# Patient Record
Sex: Male | Born: 1965 | Race: Black or African American | Hispanic: No | Marital: Married | Smoking: Current every day smoker
Health system: Southern US, Community
[De-identification: ages and names within clinical notes are randomized; demographics above are authoritative.]

## PROBLEM LIST (undated history)

## (undated) DIAGNOSIS — F1010 Alcohol abuse, uncomplicated: Secondary | ICD-10-CM

## (undated) DIAGNOSIS — K219 Gastro-esophageal reflux disease without esophagitis: Secondary | ICD-10-CM

## (undated) HISTORY — PX: OTHER SURGICAL HISTORY: SHX169

## (undated) HISTORY — PX: HAND SURGERY: SHX662

## (undated) HISTORY — PX: ANKLE SURGERY: SHX546

## (undated) HISTORY — PX: APPENDECTOMY: SHX54

---

## 1998-02-24 ENCOUNTER — Emergency Department (HOSPITAL_COMMUNITY): Admission: EM | Admit: 1998-02-24 | Discharge: 1998-02-24 | Payer: Self-pay | Admitting: Emergency Medicine

## 1998-02-27 ENCOUNTER — Emergency Department (HOSPITAL_COMMUNITY): Admission: EM | Admit: 1998-02-27 | Discharge: 1998-02-27 | Payer: Self-pay | Admitting: Emergency Medicine

## 1998-07-15 ENCOUNTER — Encounter: Payer: Self-pay | Admitting: Emergency Medicine

## 1998-07-16 ENCOUNTER — Inpatient Hospital Stay (HOSPITAL_COMMUNITY): Admission: EM | Admit: 1998-07-16 | Discharge: 1998-07-18 | Payer: Self-pay | Admitting: Emergency Medicine

## 1998-07-24 ENCOUNTER — Encounter: Admission: RE | Admit: 1998-07-24 | Discharge: 1998-07-24 | Payer: Self-pay | Admitting: Family Medicine

## 1998-08-12 ENCOUNTER — Encounter: Admission: RE | Admit: 1998-08-12 | Discharge: 1998-08-12 | Payer: Self-pay | Admitting: Family Medicine

## 1998-08-31 ENCOUNTER — Emergency Department (HOSPITAL_COMMUNITY): Admission: EM | Admit: 1998-08-31 | Discharge: 1998-08-31 | Payer: Self-pay | Admitting: Emergency Medicine

## 1999-10-13 ENCOUNTER — Emergency Department (HOSPITAL_COMMUNITY): Admission: EM | Admit: 1999-10-13 | Discharge: 1999-10-13 | Payer: Self-pay | Admitting: Emergency Medicine

## 2002-03-20 ENCOUNTER — Emergency Department (HOSPITAL_COMMUNITY): Admission: EM | Admit: 2002-03-20 | Discharge: 2002-03-20 | Payer: Self-pay | Admitting: Emergency Medicine

## 2004-09-29 ENCOUNTER — Emergency Department (HOSPITAL_COMMUNITY): Admission: EM | Admit: 2004-09-29 | Discharge: 2004-09-29 | Payer: Self-pay | Admitting: Emergency Medicine

## 2004-10-03 ENCOUNTER — Ambulatory Visit (HOSPITAL_COMMUNITY): Admission: RE | Admit: 2004-10-03 | Discharge: 2004-10-03 | Payer: Self-pay | Admitting: Orthopedic Surgery

## 2004-12-06 ENCOUNTER — Emergency Department (HOSPITAL_COMMUNITY): Admission: EM | Admit: 2004-12-06 | Discharge: 2004-12-07 | Payer: Self-pay | Admitting: Emergency Medicine

## 2004-12-08 ENCOUNTER — Ambulatory Visit (HOSPITAL_COMMUNITY): Admission: RE | Admit: 2004-12-08 | Discharge: 2004-12-08 | Payer: Self-pay | Admitting: Orthopedic Surgery

## 2005-01-13 ENCOUNTER — Emergency Department (HOSPITAL_COMMUNITY): Admission: EM | Admit: 2005-01-13 | Discharge: 2005-01-13 | Payer: Self-pay | Admitting: Emergency Medicine

## 2006-04-05 ENCOUNTER — Emergency Department (HOSPITAL_COMMUNITY): Admission: EM | Admit: 2006-04-05 | Discharge: 2006-04-05 | Payer: Self-pay | Admitting: Emergency Medicine

## 2006-04-30 ENCOUNTER — Emergency Department (HOSPITAL_COMMUNITY): Admission: EM | Admit: 2006-04-30 | Discharge: 2006-04-30 | Payer: Self-pay | Admitting: Emergency Medicine

## 2006-09-27 ENCOUNTER — Emergency Department (HOSPITAL_COMMUNITY): Admission: EM | Admit: 2006-09-27 | Discharge: 2006-09-27 | Payer: Self-pay | Admitting: Emergency Medicine

## 2007-01-03 ENCOUNTER — Emergency Department (HOSPITAL_COMMUNITY): Admission: EM | Admit: 2007-01-03 | Discharge: 2007-01-03 | Payer: Self-pay | Admitting: Family Medicine

## 2007-10-25 ENCOUNTER — Emergency Department (HOSPITAL_COMMUNITY): Admission: EM | Admit: 2007-10-25 | Discharge: 2007-10-25 | Payer: Self-pay | Admitting: Emergency Medicine

## 2008-02-05 ENCOUNTER — Emergency Department (HOSPITAL_COMMUNITY): Admission: EM | Admit: 2008-02-05 | Discharge: 2008-02-05 | Payer: Self-pay | Admitting: Emergency Medicine

## 2008-02-08 ENCOUNTER — Inpatient Hospital Stay (HOSPITAL_COMMUNITY): Admission: EM | Admit: 2008-02-08 | Discharge: 2008-02-10 | Payer: Self-pay | Admitting: Emergency Medicine

## 2008-02-08 ENCOUNTER — Ambulatory Visit: Payer: Self-pay | Admitting: Internal Medicine

## 2008-02-10 ENCOUNTER — Emergency Department (HOSPITAL_COMMUNITY): Admission: EM | Admit: 2008-02-10 | Discharge: 2008-02-10 | Payer: Self-pay | Admitting: Emergency Medicine

## 2008-04-25 ENCOUNTER — Emergency Department (HOSPITAL_COMMUNITY): Admission: EM | Admit: 2008-04-25 | Discharge: 2008-04-25 | Payer: Self-pay | Admitting: Emergency Medicine

## 2008-05-23 ENCOUNTER — Emergency Department (HOSPITAL_COMMUNITY): Admission: EM | Admit: 2008-05-23 | Discharge: 2008-05-23 | Payer: Self-pay | Admitting: *Deleted

## 2008-05-24 ENCOUNTER — Emergency Department (HOSPITAL_COMMUNITY): Admission: EM | Admit: 2008-05-24 | Discharge: 2008-05-24 | Payer: Self-pay | Admitting: Emergency Medicine

## 2008-05-26 ENCOUNTER — Emergency Department (HOSPITAL_COMMUNITY): Admission: EM | Admit: 2008-05-26 | Discharge: 2008-05-26 | Payer: Self-pay | Admitting: Emergency Medicine

## 2008-09-07 ENCOUNTER — Emergency Department (HOSPITAL_COMMUNITY): Admission: EM | Admit: 2008-09-07 | Discharge: 2008-09-07 | Payer: Self-pay | Admitting: Emergency Medicine

## 2009-03-10 ENCOUNTER — Observation Stay (HOSPITAL_COMMUNITY): Admission: EM | Admit: 2009-03-10 | Discharge: 2009-03-10 | Payer: Self-pay | Admitting: Emergency Medicine

## 2009-03-12 ENCOUNTER — Emergency Department (HOSPITAL_COMMUNITY): Admission: EM | Admit: 2009-03-12 | Discharge: 2009-03-12 | Payer: Self-pay | Admitting: Emergency Medicine

## 2009-03-16 ENCOUNTER — Emergency Department (HOSPITAL_COMMUNITY): Admission: EM | Admit: 2009-03-16 | Discharge: 2009-03-16 | Payer: Self-pay | Admitting: Emergency Medicine

## 2009-03-18 ENCOUNTER — Emergency Department (HOSPITAL_COMMUNITY): Admission: EM | Admit: 2009-03-18 | Discharge: 2009-03-18 | Payer: Self-pay | Admitting: Emergency Medicine

## 2009-06-04 ENCOUNTER — Emergency Department (HOSPITAL_COMMUNITY): Admission: EM | Admit: 2009-06-04 | Discharge: 2009-06-04 | Payer: Self-pay | Admitting: Emergency Medicine

## 2009-12-07 ENCOUNTER — Emergency Department (HOSPITAL_COMMUNITY): Admission: EM | Admit: 2009-12-07 | Discharge: 2009-12-07 | Payer: Self-pay | Admitting: Emergency Medicine

## 2009-12-30 ENCOUNTER — Emergency Department (HOSPITAL_COMMUNITY): Admission: EM | Admit: 2009-12-30 | Discharge: 2009-12-30 | Payer: Self-pay | Admitting: Emergency Medicine

## 2010-01-08 ENCOUNTER — Emergency Department (HOSPITAL_COMMUNITY): Admission: EM | Admit: 2010-01-08 | Discharge: 2010-01-08 | Payer: Self-pay | Admitting: Emergency Medicine

## 2010-02-03 ENCOUNTER — Emergency Department (HOSPITAL_COMMUNITY): Admission: EM | Admit: 2010-02-03 | Discharge: 2010-02-03 | Payer: Self-pay | Admitting: Emergency Medicine

## 2010-06-25 ENCOUNTER — Emergency Department (HOSPITAL_COMMUNITY): Admission: EM | Admit: 2010-06-25 | Discharge: 2010-06-25 | Payer: Self-pay | Admitting: Emergency Medicine

## 2011-01-01 LAB — BASIC METABOLIC PANEL WITH GFR
BUN: 11 mg/dL (ref 6–23)
CO2: 26 meq/L (ref 19–32)
Calcium: 9.6 mg/dL (ref 8.4–10.5)
Chloride: 110 meq/L (ref 96–112)
Creatinine, Ser: 0.85 mg/dL (ref 0.4–1.5)
GFR calc non Af Amer: >60 mL/min
Glucose, Bld: 94 mg/dL (ref 70–99)
Potassium: 3.7 meq/L (ref 3.5–5.1)
Sodium: 141 meq/L (ref 135–145)

## 2011-01-01 LAB — DIFFERENTIAL
Basophils Absolute: 0.1 10*3/uL (ref 0.0–0.1)
Basophils Relative: 1 % (ref 0–1)
Eosinophils Absolute: 0.3 10*3/uL (ref 0.0–0.7)
Eosinophils Relative: 4 % (ref 0–5)
Lymphocytes Relative: 53 % — ABNORMAL HIGH (ref 12–46)
Lymphs Abs: 3.5 10*3/uL (ref 0.7–4.0)
Monocytes Absolute: 0.4 10*3/uL (ref 0.1–1.0)
Monocytes Relative: 6 % (ref 3–12)
Neutro Abs: 2.4 10*3/uL (ref 1.7–7.7)
Neutrophils Relative %: 36 % — ABNORMAL LOW (ref 43–77)

## 2011-01-01 LAB — SEDIMENTATION RATE: Sed Rate: 0 mm/h (ref 0–16)

## 2011-01-01 LAB — CBC
MCH: 24 pg — ABNORMAL LOW (ref 26.0–34.0)
MCV: 72.1 fL — ABNORMAL LOW (ref 78.0–100.0)
Platelets: 269 10*3/uL (ref 150–400)
RBC: 5.45 MIL/uL (ref 4.22–5.81)

## 2011-01-06 LAB — COMPREHENSIVE METABOLIC PANEL
Albumin: 4.6 g/dL (ref 3.5–5.2)
BUN: 10 mg/dL (ref 6–23)
CO2: 26 mEq/L (ref 19–32)
Calcium: 9.7 mg/dL (ref 8.4–10.5)
Chloride: 102 mEq/L (ref 96–112)
GFR calc Af Amer: >60 mL/min (ref >60)
Potassium: 3.5 mEq/L (ref 3.5–5.1)
Total Protein: 8.3 g/dL (ref 6.0–8.3)

## 2011-01-06 LAB — CBC
HCT: 44.2 % (ref 39.0–52.0)
Hemoglobin: 14.5 g/dL (ref 13.0–17.0)
MCHC: 32.8 g/dL (ref 30.0–36.0)
RDW: 15.4 % (ref 11.5–15.5)

## 2011-01-06 LAB — DIFFERENTIAL
Basophils Absolute: 0 10*3/uL (ref 0.0–0.1)
Basophils Relative: 0 % (ref 0–1)
Eosinophils Absolute: 0 10*3/uL (ref 0.0–0.7)
Lymphs Abs: 1.6 10*3/uL (ref 0.7–4.0)
Neutrophils Relative %: 82 % — ABNORMAL HIGH (ref 43–77)

## 2011-01-07 LAB — COMPREHENSIVE METABOLIC PANEL
BUN: 13 mg/dL (ref 6–23)
CO2: 26 mEq/L (ref 19–32)
Chloride: 108 mEq/L (ref 96–112)
Creatinine, Ser: 0.95 mg/dL (ref 0.4–1.5)
GFR calc non Af Amer: >60 mL/min (ref >60)
Glucose, Bld: 94 mg/dL (ref 70–99)
Total Bilirubin: 0.6 mg/dL (ref 0.3–1.2)

## 2011-01-07 LAB — DIFFERENTIAL
Basophils Absolute: 0.1 10*3/uL (ref 0.0–0.1)
Eosinophils Relative: 1 % (ref 0–5)
Lymphocytes Relative: 42 % (ref 12–46)
Neutro Abs: 4 10*3/uL (ref 1.7–7.7)

## 2011-01-07 LAB — CBC
HCT: 42.9 % (ref 39.0–52.0)
MCV: 77.1 fL — ABNORMAL LOW (ref 78.0–100.0)
RBC: 5.56 MIL/uL (ref 4.22–5.81)
WBC: 7.9 10*3/uL (ref 4.0–10.5)

## 2011-01-07 LAB — LIPASE, BLOOD: Lipase: 20 U/L (ref 11–59)

## 2011-01-12 LAB — DIFFERENTIAL
Basophils Absolute: 0.1 10*3/uL (ref 0.0–0.1)
Basophils Absolute: 0 10*3/uL (ref 0.0–0.1)
Basophils Relative: 1 % (ref 0–1)
Basophils Relative: 1 % (ref 0–1)
Eosinophils Absolute: 0.3 10*3/uL (ref 0.0–0.7)
Lymphocytes Relative: 35 % (ref 12–46)
Monocytes Absolute: 0.5 10*3/uL (ref 0.1–1.0)
Neutro Abs: 2.5 10*3/uL (ref 1.7–7.7)
Neutro Abs: 4 10*3/uL (ref 1.7–7.7)
Neutrophils Relative %: 38 % — ABNORMAL LOW (ref 43–77)
Neutrophils Relative %: 57 % (ref 43–77)

## 2011-01-12 LAB — COMPREHENSIVE METABOLIC PANEL
Albumin: 3.9 g/dL (ref 3.5–5.2)
Alkaline Phosphatase: 64 U/L (ref 39–117)
Alkaline Phosphatase: 58 U/L (ref 39–117)
BUN: 12 mg/dL (ref 6–23)
BUN: 10 mg/dL (ref 6–23)
CO2: 29 mEq/L (ref 19–32)
Chloride: 105 mEq/L (ref 96–112)
Chloride: 109 mEq/L (ref 96–112)
Creatinine, Ser: 0.91 mg/dL (ref 0.4–1.5)
GFR calc non Af Amer: >60 mL/min (ref >60)
Glucose, Bld: 112 mg/dL — ABNORMAL HIGH (ref 70–99)
Glucose, Bld: 97 mg/dL (ref 70–99)
Potassium: 5.2 mEq/L — ABNORMAL HIGH (ref 3.5–5.1)
Potassium: 3.5 mEq/L (ref 3.5–5.1)
Total Bilirubin: <0.1 mg/dL — ABNORMAL LOW (ref 0.3–1.2)
Total Bilirubin: 0.6 mg/dL (ref 0.3–1.2)
Total Protein: 6.9 g/dL (ref 6.0–8.3)

## 2011-01-12 LAB — PROTIME-INR
INR: 0.89 (ref 0.00–1.49)
Prothrombin Time: 12 seconds (ref 11.6–15.2)

## 2011-01-12 LAB — CBC
HCT: 41 % (ref 39.0–52.0)
HCT: 41.6 % (ref 39.0–52.0)
Hemoglobin: 12.8 g/dL — ABNORMAL LOW (ref 13.0–17.0)
Hemoglobin: 13.6 g/dL (ref 13.0–17.0)
MCV: 77.2 fL — ABNORMAL LOW (ref 78.0–100.0)
Platelets: 240 10*3/uL (ref 150–400)
RBC: 5.29 MIL/uL (ref 4.22–5.81)
RDW: 16.4 % — ABNORMAL HIGH (ref 11.5–15.5)
RDW: 15.6 % — ABNORMAL HIGH (ref 11.5–15.5)
WBC: 7.2 10*3/uL (ref 4.0–10.5)

## 2011-01-12 LAB — LIPASE, BLOOD: Lipase: 15 U/L (ref 11–59)

## 2011-01-12 LAB — URINALYSIS, ROUTINE W REFLEX MICROSCOPIC
Bilirubin Urine: NEGATIVE
Bilirubin Urine: NEGATIVE
Glucose, UA: NEGATIVE mg/dL
Hgb urine dipstick: NEGATIVE
Nitrite: NEGATIVE
Protein, ur: NEGATIVE mg/dL
Specific Gravity, Urine: 1.015 (ref 1.005–1.030)
Urobilinogen, UA: 0.2 mg/dL (ref 0.0–1.0)
Urobilinogen, UA: 1 mg/dL (ref 0.0–1.0)

## 2011-01-12 LAB — ETHANOL: Alcohol, Ethyl (B): <5 mg/dL (ref 0–10)

## 2011-01-12 LAB — URINE CULTURE

## 2011-01-27 LAB — COMPREHENSIVE METABOLIC PANEL
ALT: 36 U/L (ref 0–53)
ALT: 21 U/L (ref 0–53)
AST: 53 U/L — ABNORMAL HIGH (ref 0–37)
AST: 29 U/L (ref 0–37)
Albumin: 4 g/dL (ref 3.5–5.2)
CO2: 29 mEq/L (ref 19–32)
CO2: 28 mEq/L (ref 19–32)
Calcium: 10.1 mg/dL (ref 8.4–10.5)
Chloride: 104 mEq/L (ref 96–112)
Creatinine, Ser: 0.81 mg/dL (ref 0.4–1.5)
GFR calc Af Amer: >60 mL/min (ref >60)
GFR calc Af Amer: >60 mL/min (ref >60)
GFR calc non Af Amer: >60 mL/min (ref >60)
Potassium: 3.2 mEq/L — ABNORMAL LOW (ref 3.5–5.1)
Potassium: 3.8 mEq/L (ref 3.5–5.1)
Sodium: 140 mEq/L (ref 135–145)
Sodium: 140 mEq/L (ref 135–145)
Total Bilirubin: 0.5 mg/dL (ref 0.3–1.2)

## 2011-01-27 LAB — URINALYSIS, ROUTINE W REFLEX MICROSCOPIC
Bilirubin Urine: NEGATIVE
Glucose, UA: NEGATIVE mg/dL
Hgb urine dipstick: NEGATIVE
Ketones, ur: 15 mg/dL — AB
Nitrite: NEGATIVE
Protein, ur: NEGATIVE mg/dL
Protein, ur: NEGATIVE mg/dL
Specific Gravity, Urine: 1.011 (ref 1.005–1.030)
Urobilinogen, UA: 0.2 mg/dL (ref 0.0–1.0)

## 2011-01-27 LAB — DIFFERENTIAL
Basophils Absolute: 0.1 10*3/uL (ref 0.0–0.1)
Eosinophils Absolute: 0.1 10*3/uL (ref 0.0–0.7)
Eosinophils Absolute: 0 10*3/uL (ref 0.0–0.7)
Eosinophils Relative: 1 % (ref 0–5)
Eosinophils Relative: 0 % (ref 0–5)
Lymphocytes Relative: 18 % (ref 12–46)
Lymphs Abs: 1.8 10*3/uL (ref 0.7–4.0)
Lymphs Abs: 1.6 10*3/uL (ref 0.7–4.0)
Monocytes Absolute: 0.3 10*3/uL (ref 0.1–1.0)
Monocytes Relative: 2 % — ABNORMAL LOW (ref 3–12)

## 2011-01-27 LAB — POCT I-STAT, CHEM 8
BUN: 8 mg/dL (ref 6–23)
Calcium, Ion: 1.06 mmol/L — ABNORMAL LOW (ref 1.12–1.32)
Calcium, Ion: 1.12 mmol/L (ref 1.12–1.32)
Chloride: 108 mEq/L (ref 96–112)
Creatinine, Ser: 1 mg/dL (ref 0.4–1.5)
Creatinine, Ser: 1.1 mg/dL (ref 0.4–1.5)
Glucose, Bld: 117 mg/dL — ABNORMAL HIGH (ref 70–99)
Potassium: 3.6 mEq/L (ref 3.5–5.1)
TCO2: 28 mmol/L (ref 0–100)

## 2011-01-27 LAB — CBC
MCHC: 32.7 g/dL (ref 30.0–36.0)
MCV: 75.7 fL — ABNORMAL LOW (ref 78.0–100.0)
Platelets: 308 10*3/uL (ref 150–400)
RBC: 5.47 MIL/uL (ref 4.22–5.81)
RBC: 6.25 MIL/uL — ABNORMAL HIGH (ref 4.22–5.81)
WBC: 9.8 10*3/uL (ref 4.0–10.5)
WBC: 9.3 10*3/uL (ref 4.0–10.5)

## 2011-01-27 LAB — LIPASE, BLOOD: Lipase: 17 U/L (ref 11–59)

## 2011-03-03 NOTE — Consult Note (Signed)
[name], [name] NO.:  [id number]   MEDICAL RECORD NO.:  [id number]           PATIENT TYPE:   LOCATION:                                 FACILITY:   PHYSICIAN:  Stephani Police, PA    DATE OF BIRTH:  01-Jul-1966   DATE OF CONSULTATION:  02/09/2008  DATE OF DISCHARGE:                                 CONSULTATION   REFERRING PHYSICIAN:  1. Lowella Bandy of [facility].  2. Dr.  Fayrene Fearing L. Edwards of  [facility].   We were asked to see by Dr. Cherly Hensen of [facility] for a  guaiac positive stool and abdominal pain and vomiting.   HISTORY OF PRESENT ILLNESS:  This is a 44 year old male, who is admitted  with 5 days of vomiting.  He tells me that he has epigastric and right  upper quadrant pain.  He reports vomiting started after eating chicken  last Saturday.  He vomited so much that he went to the ER on Sunday.  He  was released and he had to return on Wednesday with continued vomiting  and severe epigastric pain.  He reports 20 years of intermittent  abdominal pain and vomiting.  He says, he is usually able to work  through these episodes on his own.  He denies any hematemesis this past  week and he denies any changes in bowel habit.  He does tell me that he  has had tooth pain that necessitated him taking 4-6 ibuprofen 800 mg  tablets as well as 8 Tylenol 3 use over the last week.  He does also  report a history of heavy alcohol use, but now drinks much less for the  past month.   PAST MEDICAL HISTORY:  Significant for:  1. Pancreatitis.  2. Alcoholic gastritis.  3. GERD.  4. Wrist surgery.  5. History of tuberculosis.   He has never had an endoscopy or a colonoscopy.   CURRENT MEDICATIONS:  1. Zantac.  2. Percocet.  3. Phenergan.  4. 800 mg ibuprofen as well as Tylenol 3.   ALLERGIES:  He has allergies to TYLENOL and IBUPROFEN that cause GI  upset.   FAMILY HISTORY:  Positive for lung cancer, negative for colon cancer and  peptic ulcer disease.   SOCIAL HISTORY:  Positive for tobacco and positive for occasional  alcohol use.  He denies drug use, but his urine drug screen is positive  for THC and opiates.   REVIEW OF SYSTEMS:  Positive for tooth pain.  Negative for fever,  palpitations or shortness of breath.   PHYSICAL EXAMINATION:  GENERAL:  He is alert and oriented in no apparent  distress.  He appears healthy.  VITAL SIGNS:  Temperature is 97.7, pulse is 74, respirations are 19 and  blood pressure is 118/65.  CARDIAC:  His heart has a regular rate and rhythm.  LUNGS:  Clear to auscultation without wheezes or crackles.  ABDOMEN:  Soft, nontender and nondistended with positive bowel sounds.  He is guaiac positive.   LABORATORY DATA:  Again, urine drug screen is positive for  THC and  opiates.  His hemoglobin on admission, yesterday, was 16.4 and with the  hydration it has dropped to 12.8 today.  He has a hematocrit of 39.6  now, white count 9.1 and platelets 232,000.  His lipase was 63 on  admission and is 18 now.  He is guaiac positive.  He has a PT of 13.3,  INR of 1, creatinine kinase is 6867, AST is 156, ALT is 49, alk phos 62  and total bilirubin 0.5.  Abdominal x-ray shows no acute findings.  Abdominal ultrasound shows a normal gallbladder.  No ascites and normal  common bile duct.   ASSESSMENT:  Dr.  Carman Ching has seen and examined the patient,  collected a history and his impression is that this patient has had  longterm abdominal pain with nausea and vomiting.  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  We will evaluate for  a possible ulcer disease or gastroenteritis with an upper endoscopy  first thing tomorrow morning at 08:30 a.m.      Stephani Police, [place]     MLY/MEDQ  D:  02/09/2008  T:  02/10/2008  Job:  [id number]   cc:   Fayrene Fearing L. Malon Kindle., M.D.

## 2011-03-03 NOTE — Op Note (Signed)
[name], [name] NO.:  [id number]   MEDICAL RECORD NO.:  [id number]          PATIENT TYPE:  EMS   LOCATION:  MAJO                         FACILITY:  [facility]   PHYSICIAN:  James L. Malon Kindle., M.D.DATE OF BIRTH:  [birth year]   DATE OF PROCEDURE:  02/10/2008  DATE OF DISCHARGE:                               OPERATIVE REPORT   PROCEDURE:  Esophagogastroduodenoscopy with biopsy.   MEDICATIONS:  1. Phenergan 25 mg.  2. Cetacaine spray.  3. Fentanyl 100 mcg.  4. Versed 10 mg IV.   INDICATIONS:  Hematemesis and abdominal pain.   DESCRIPTION OF PROCEDURE:  The procedure explained to the patient and  consent obtained.  In the left lateral decubitus position, Pentax  operative scope inserted blindly in the esophagus and advanced under  direct visualization.  The patient had a widely patent GE junction with  a hiatal hernia, with ulcerative esophagitis in the hernia sac and in  the distal esophagus.  The proximal stomach was normal.  There were  several gastric erosions and a small gastric ulcer in immediate  prepyloric area.  In the duodenum was duodenitis and small duodenal  ulcers, not bleeding.  There was no active bleeding.  The scope was  drawn back into the stomach and biopsy was taken for rapid urease test  for Helicobacter.  The scope was withdrawn and initial findings  confirmed.  The patient tolerated the procedure well.  There were no  immediate complications.   ASSESSMENT:  1. Gastric ulcer, duodenal ulcer, duodenitis, and gastritis.  2. Ulcerative esophagitis.   PLAN:  We will discharge on PPI therapy.  Check CLO test.  Have the  patient see me in the office in 2 months, and we may consider another  study to document healing of his ulcer.           ______________________________  Llana Aliment. Malon Kindle., M.D.     Waldron Session  D:  02/10/2008  T:  02/11/2008  Job:  [id number]   cc:   C. Ulyess Mort, M.D.
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
## 2011-03-06 NOTE — Op Note (Signed)
NAME:  [name], [name] NO.:  [id number]   MEDICAL RECORD NO.:  [id number]          PATIENT TYPE:  OIB   LOCATION:                               FACILITY:  [facility]   PHYSICIAN:  Artist Pais. 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   PROCEDURE:  Open reduction and internal fixation above using dorsal and  volar approaches. .   SURGEON:  Artist Pais. Mina Marble, M.D.   ASSISTANT:  Aura Fey. Bobbe Medico.   ANESTHESIA:  General.   TOURNIQUET TIME:  1 hour and 10 minutes.   COMPLICATIONS:  None.   DRAINS:  None.   DESCRIPTION OF PROCEDURE:  The patient was taken to the operating room.  After the induction of adequate general anesthesia, the right upper  extremity was prepped and draped in the usual sterile fashion.  An Esmarch  was used to exsanguinate the limb.  Tourniquet was inflated to 250 mmHg.  At  this point in time, an extended carpal tunnel incision was made on the volar  aspect of the right wrist.  Incision was taken down through the skin and  subcutaneous tissue.  The median nerve was identified proximally in the  carpal canal, and the carpal was loosed.  The flexor tendons were retracted  to the side.  There was a large amount of bleeding and hemorrhage in the  volar wrist capsule, and the lunate was seen outside the carpal tunnel.  The  lunate had a small amount of periosteum still attached to it in a remnant of  the capsule.  This was reduced into the radial carpal joint.  The disrupted  capsule was repaired with FiberWire #2 with vertical mattress sutures.  The  hand was then irrigated, fully pronated.  A dorsal approach was made.  The  extensor ends were retracted.   The capsule was incised over the SL ligament  area.  There was complete disruption of the SL ligament.  The scapholunate  interval was reduced and pinned using 0.62 K wires x 2 from the scaphoid  into the lunate followed by a distal scaphoid pin into the capitate to keep  the scaphoid stable.  These wires were all bent below the skin after being  cut through a separate incision.  The dorsal capsule and SL  ligament area was repaired using 2-0 FiberWire followed by capsule closure  again with 2-0 FiberWire, and the skin on both sides with nylon sutures. The  patient was placed in a sterile dressing with Xeroform, 4 x 4 fluffs, and a  volar splint.  The patient tolerated the procedure well and went to recovery  in stable fashion.      Matt   MAW/MEDQ  D:  10/08/2004  T:  10/08/2004  Job:  [id number]

## 2011-04-19 IMAGING — CR DG ABDOMEN ACUTE W/ 1V CHEST
3 series · 3 of 3 positions shown · non-contrast
Comparison: 03/10/2009

CLINICAL DATA: Questioned ulcer.  Abdominal pain.  Smoker.

ACUTE ABDOMEN SERIES (ABDOMEN 2 VIEW & CHEST 1 VIEW)

[w chest pa]
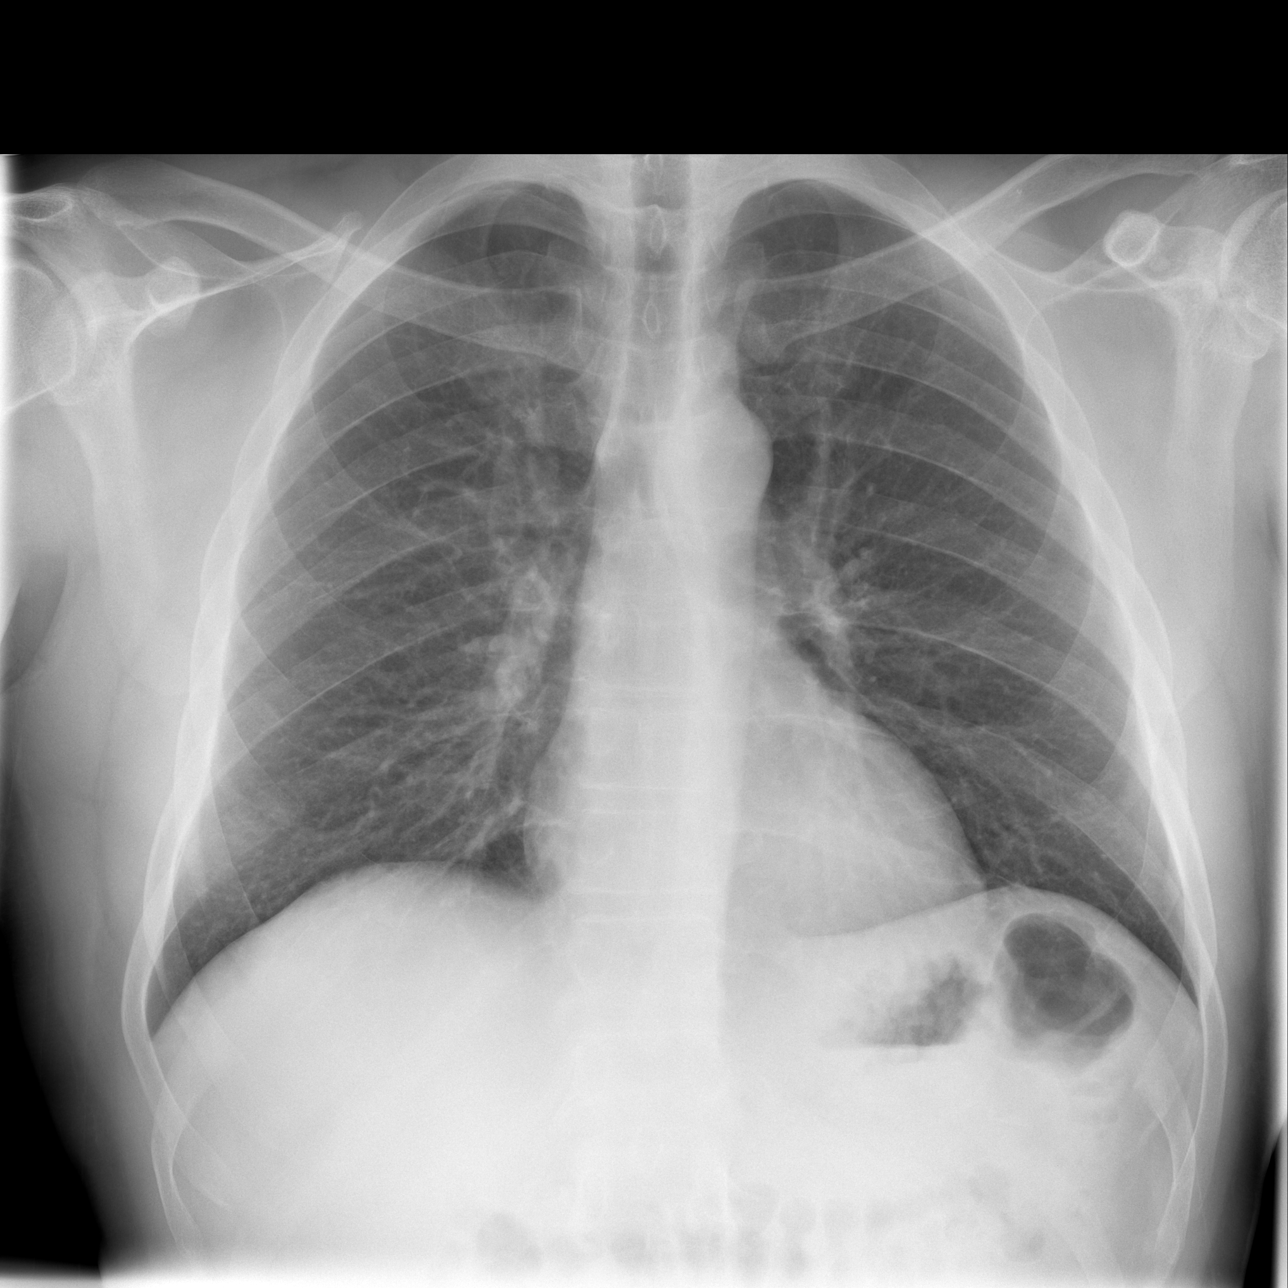

[w abdomen upright]
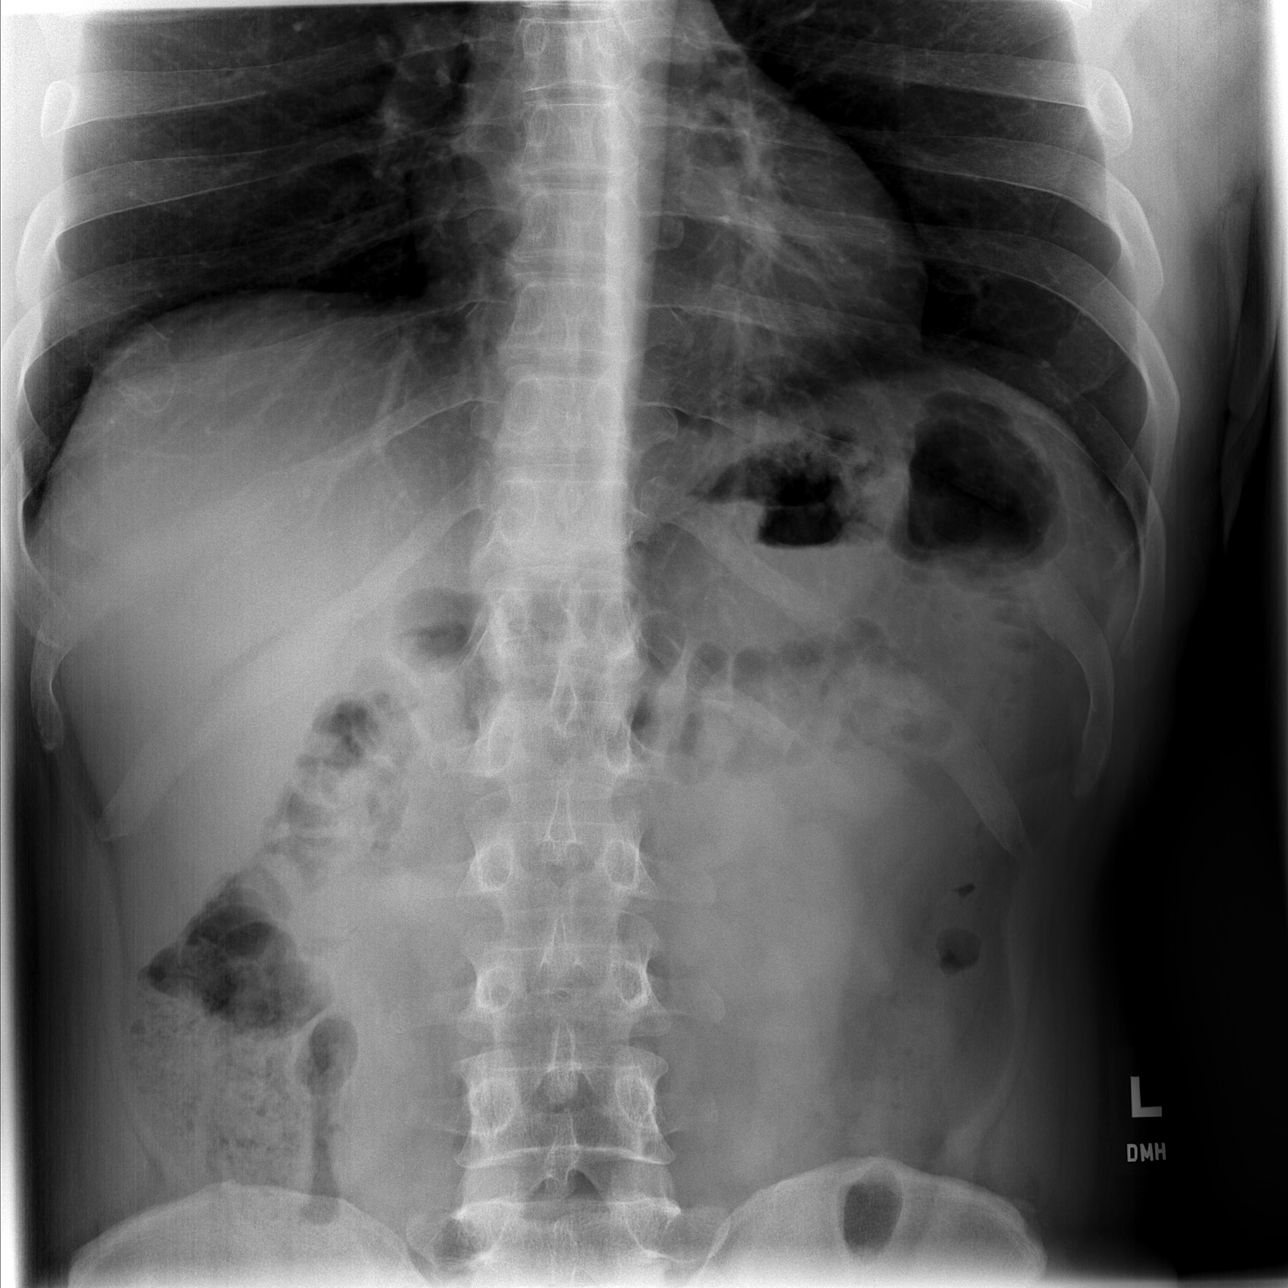

[t abdomen supine]
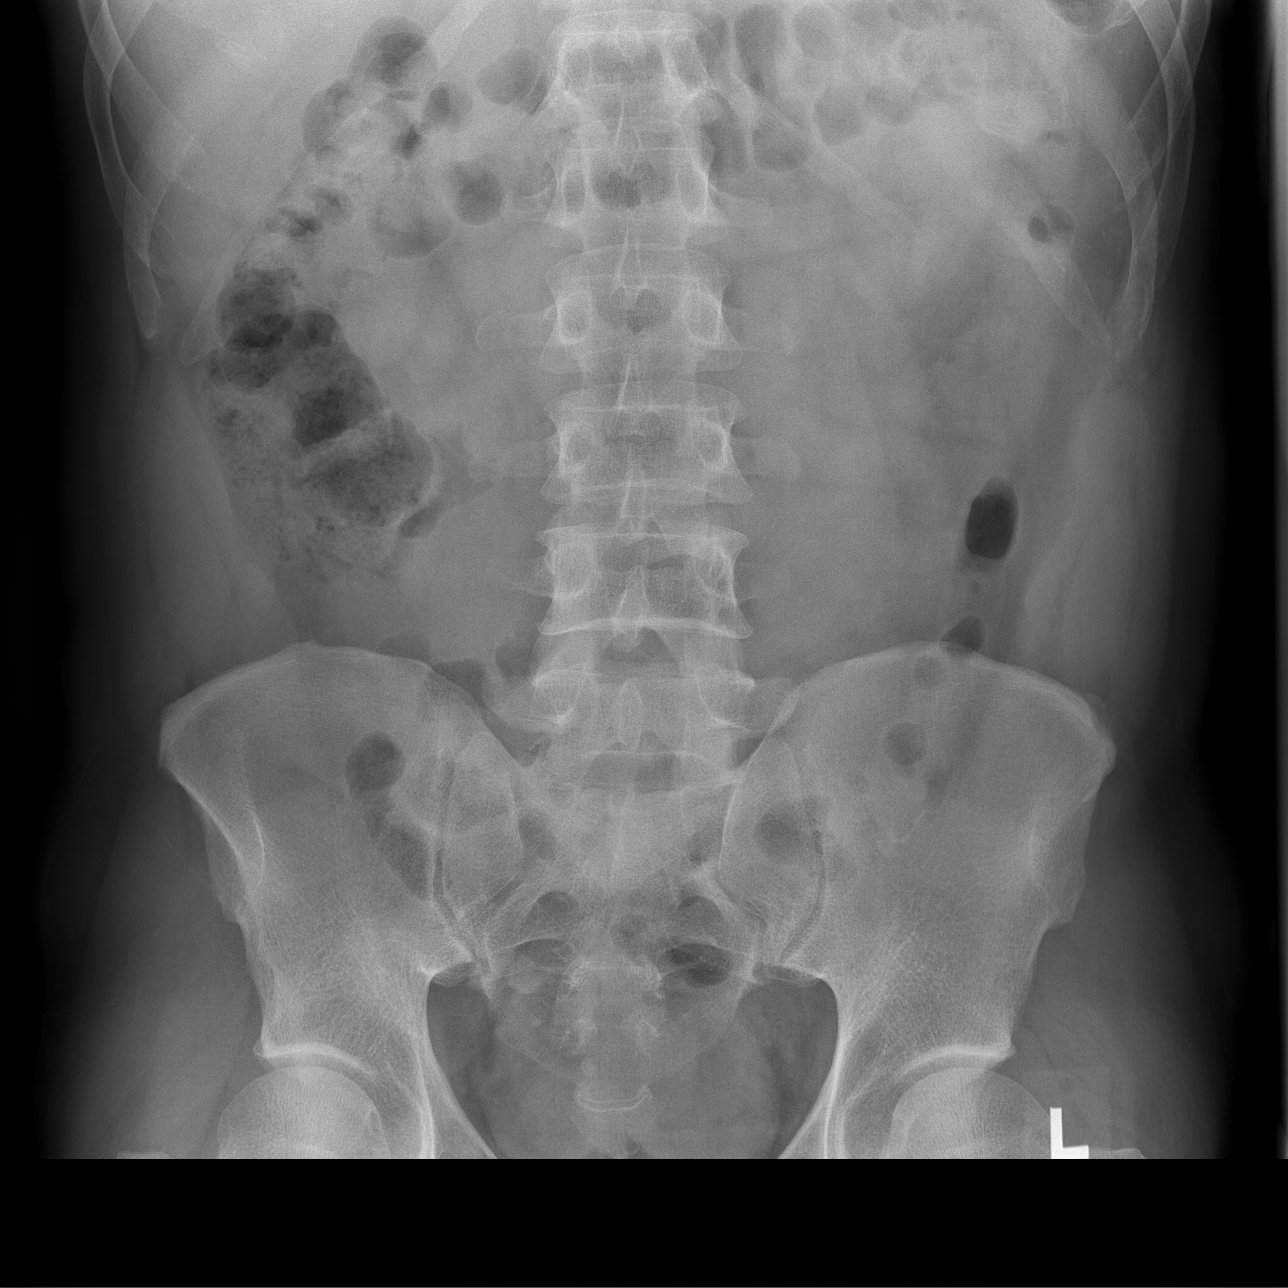

[3 of 3 positions shown; findings below may reference images not displayed]

FINDINGS: No active cardiopulmonary disease.  Bowel gas pattern
unremarkable.  No extraluminal gas.  No unusual calcifications.
IMPRESSION: Negative chest and abdomen.

## 2011-05-08 ENCOUNTER — Emergency Department (HOSPITAL_COMMUNITY)
Admission: EM | Admit: 2011-05-08 | Discharge: 2011-05-08 | Disposition: A | Discharge status: Home / Self Care | Payer: Self-pay | Attending: Emergency Medicine | Admitting: Emergency Medicine

## 2011-05-08 ENCOUNTER — Emergency Department (HOSPITAL_COMMUNITY): Payer: Self-pay

## 2011-05-08 DIAGNOSIS — M25519 Pain in unspecified shoulder: Secondary | ICD-10-CM | POA: Insufficient documentation

## 2011-05-08 DIAGNOSIS — Z8711 Personal history of peptic ulcer disease: Secondary | ICD-10-CM | POA: Insufficient documentation

## 2011-05-08 DIAGNOSIS — F17200 Nicotine dependence, unspecified, uncomplicated: Secondary | ICD-10-CM | POA: Insufficient documentation

## 2011-05-08 DIAGNOSIS — K219 Gastro-esophageal reflux disease without esophagitis: Secondary | ICD-10-CM | POA: Insufficient documentation

## 2011-07-09 LAB — COMPREHENSIVE METABOLIC PANEL
Albumin: 5.2
BUN: 20
Chloride: 95 — ABNORMAL LOW
Creatinine, Ser: 1.22
Total Bilirubin: 1.6 — ABNORMAL HIGH
Total Protein: 8.6 — ABNORMAL HIGH

## 2011-07-09 LAB — DIFFERENTIAL
Basophils Absolute: 0
Lymphocytes Relative: 18
Monocytes Absolute: 0.9
Neutro Abs: 8.8 — ABNORMAL HIGH

## 2011-07-09 LAB — CBC
HCT: 50
MCV: 74.1 — ABNORMAL LOW
Platelets: 342
RDW: 15.6 — ABNORMAL HIGH

## 2011-07-14 LAB — POCT I-STAT, CHEM 8
BUN: 23
Calcium, Ion: 1.1 — ABNORMAL LOW
Chloride: 96
Creatinine, Ser: 1.3
TCO2: 36

## 2011-07-14 LAB — DIFFERENTIAL
Basophils Absolute: 0
Basophils Absolute: 0
Basophils Relative: 0
Basophils Relative: 1
Eosinophils Absolute: 0
Eosinophils Absolute: 0.1
Eosinophils Relative: 1
Lymphocytes Relative: 21
Lymphs Abs: 2.5
Monocytes Absolute: 0.6
Monocytes Relative: 9
Neutro Abs: 6.7
Neutro Abs: 8.2 — ABNORMAL HIGH
Neutrophils Relative %: 69
Neutrophils Relative %: 84 — ABNORMAL HIGH

## 2011-07-14 LAB — URINALYSIS, ROUTINE W REFLEX MICROSCOPIC
Bilirubin Urine: NEGATIVE
Bilirubin Urine: NEGATIVE
Glucose, UA: NEGATIVE
Hgb urine dipstick: NEGATIVE
Hgb urine dipstick: NEGATIVE
Ketones, ur: 15 — AB
Leukocytes, UA: NEGATIVE
Nitrite: NEGATIVE
Protein, ur: NEGATIVE
Protein, ur: NEGATIVE
Specific Gravity, Urine: 1.022
Specific Gravity, Urine: 1.024
Urobilinogen, UA: 0.2
Urobilinogen, UA: 0.2
pH: 7.5

## 2011-07-14 LAB — CBC
HCT: 39.6
HCT: 46.3
HCT: 46.3
Hemoglobin: 13
Hemoglobin: 14.8
Hemoglobin: 15.4
MCHC: 33.3
MCV: 74.2 — ABNORMAL LOW
MCV: 74.2 — ABNORMAL LOW
Platelets: 272
Platelets: 306
RBC: 6.84 — ABNORMAL HIGH
RDW: 14.4
RDW: 14.5
RDW: 14.6
WBC: 11.9 — ABNORMAL HIGH
WBC: 5.7
WBC: 8
WBC: 9.1

## 2011-07-14 LAB — COMPREHENSIVE METABOLIC PANEL
Albumin: 3.3 — ABNORMAL LOW
Alkaline Phosphatase: 63
BUN: 7
BUN: 7
Calcium: 8.8
Chloride: 99
Creatinine, Ser: 0.88
GFR calc non Af Amer: >60
Glucose, Bld: 107 — ABNORMAL HIGH
Glucose, Bld: 94
Potassium: 3.3 — ABNORMAL LOW
Total Bilirubin: 0.7
Total Protein: 6

## 2011-07-14 LAB — BASIC METABOLIC PANEL
BUN: 14
CO2: 28
Chloride: 109
GFR calc non Af Amer: >60
GFR calc non Af Amer: >60
Glucose, Bld: 126 — ABNORMAL HIGH
Glucose, Bld: 98
Potassium: 3.7
Potassium: 4
Sodium: 140
Sodium: 142

## 2011-07-14 LAB — URINE MICROSCOPIC-ADD ON

## 2011-07-14 LAB — RETICULOCYTES: RBC.: 5.54

## 2011-07-14 LAB — HEPATITIS PANEL, ACUTE
HCV Ab: POSITIVE — AB
Hep A IgM: NEGATIVE
Hep B C IgM: NEGATIVE
Hepatitis B Surface Ag: NEGATIVE

## 2011-07-14 LAB — OCCULT BLOOD X 1 CARD TO LAB, STOOL: Fecal Occult Bld: POSITIVE

## 2011-07-14 LAB — HEPATIC FUNCTION PANEL
ALT: 21
ALT: 49
AST: 35
Albumin: 4.8
Alkaline Phosphatase: 65
Bilirubin, Direct: <0.1
Total Bilirubin: 0.9
Total Protein: 7.7

## 2011-07-14 LAB — CARDIAC PANEL(CRET KIN+CKTOT+MB+TROPI)
CK, MB: 3.1
CK, MB: 3.9
CK, MB: 4.8 — ABNORMAL HIGH
Relative Index: 0.1
Relative Index: 0.1
Relative Index: 0.1
Total CK: 5215 — ABNORMAL HIGH
Total CK: 6867 — ABNORMAL HIGH
Total CK: 7729 — ABNORMAL HIGH
Troponin I: 0.01
Troponin I: 0.02
Troponin I: 0.02

## 2011-07-14 LAB — APTT: aPTT: 31

## 2011-07-14 LAB — ETHANOL
Alcohol, Ethyl (B): <5
Alcohol, Ethyl (B): <5

## 2011-07-14 LAB — CLOTEST (H. PYLORI), BIOPSY: Helicobacter screen: POSITIVE — AB

## 2011-07-14 LAB — IRON AND TIBC
Iron: 225 — ABNORMAL HIGH
UIBC: <55

## 2011-07-14 LAB — RAPID URINE DRUG SCREEN, HOSP PERFORMED
Amphetamines: NOT DETECTED
Barbiturates: NOT DETECTED

## 2011-07-14 LAB — HELICOBACTER PYLORI ANTIGEN DET, STOOL: H pylori Ag, Stl: NEGATIVE

## 2011-07-14 LAB — FERRITIN: Ferritin: 237 (ref 22–322)

## 2011-07-17 LAB — COMPREHENSIVE METABOLIC PANEL
ALT: 32
Albumin: 4.3
Alkaline Phosphatase: 61
BUN: 16
Chloride: 103
Glucose, Bld: 121 — ABNORMAL HIGH
Potassium: 3.4 — ABNORMAL LOW
Sodium: 136
Total Bilirubin: 1
Total Protein: 7.6

## 2011-07-17 LAB — POCT I-STAT, CHEM 8
BUN: 20
Calcium, Ion: 1.21
Chloride: 104
Creatinine, Ser: 1.1
TCO2: 27

## 2011-07-17 LAB — URINALYSIS, ROUTINE W REFLEX MICROSCOPIC
Leukocytes, UA: NEGATIVE
Nitrite: NEGATIVE
Specific Gravity, Urine: 1.036 — ABNORMAL HIGH
pH: 6

## 2011-07-17 LAB — HEPATIC FUNCTION PANEL
Albumin: 4.4
Alkaline Phosphatase: 64
Total Protein: 8.6 — ABNORMAL HIGH

## 2011-07-17 LAB — LIPASE, BLOOD: Lipase: 18

## 2011-07-17 LAB — DIFFERENTIAL
Basophils Absolute: 0
Basophils Absolute: 0
Basophils Relative: 0
Basophils Relative: 0
Eosinophils Absolute: 0
Eosinophils Absolute: 0
Lymphs Abs: 2.1
Monocytes Absolute: 0.3
Monocytes Relative: 3
Neutro Abs: 8.3 — ABNORMAL HIGH
Neutrophils Relative %: 65

## 2011-07-17 LAB — CBC
HCT: 45.9
Hemoglobin: 14.6
MCV: 75.5 — ABNORMAL LOW
Platelets: 291
Platelets: 308
WBC: 10.1
WBC: 9.1

## 2011-07-17 LAB — URINE MICROSCOPIC-ADD ON

## 2011-07-17 LAB — B-NATRIURETIC PEPTIDE (CONVERTED LAB): Pro B Natriuretic peptide (BNP): 43

## 2011-07-17 LAB — TROPONIN I: Troponin I: 0.01

## 2011-10-09 ENCOUNTER — Emergency Department (HOSPITAL_COMMUNITY)
Admission: EM | Admit: 2011-10-09 | Discharge: 2011-10-09 | Disposition: A | Discharge status: Home / Self Care | Payer: Self-pay | Attending: Emergency Medicine | Admitting: Emergency Medicine

## 2011-10-09 ENCOUNTER — Emergency Department (HOSPITAL_COMMUNITY): Payer: Self-pay

## 2011-10-09 ENCOUNTER — Encounter: Payer: Self-pay | Admitting: *Deleted

## 2011-10-09 DIAGNOSIS — M25562 Pain in left knee: Secondary | ICD-10-CM

## 2011-10-09 DIAGNOSIS — M25569 Pain in unspecified knee: Secondary | ICD-10-CM | POA: Insufficient documentation

## 2011-10-09 DIAGNOSIS — M7120 Synovial cyst of popliteal space [Baker], unspecified knee: Secondary | ICD-10-CM

## 2011-10-09 HISTORY — DX: Gastro-esophageal reflux disease without esophagitis: K21.9

## 2011-10-09 MED ORDER — OXYCODONE-ACETAMINOPHEN 5-325 MG PO TABS: 1.0000 | ORAL_TABLET | ORAL | Status: AC | PRN

## 2011-10-09 MED ORDER — IBUPROFEN 200 MG PO TABS
400.0000 mg | ORAL_TABLET | Freq: Once | ORAL | Status: AC
  Administered 2011-10-09: 400 mg via ORAL
  Filled 2011-10-09: qty 1
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
## 2011-10-09 NOTE — ED Notes (Signed)
Pt in c/o left knee swelling since Tuesday, denies injury, swelling noted, states it feels like there is fluid on his knee

## 2011-12-15 ENCOUNTER — Emergency Department (HOSPITAL_COMMUNITY)
Admission: EM | Admit: 2011-12-15 | Discharge: 2011-12-15 | Disposition: A | Discharge status: Home / Self Care | Payer: Self-pay | Attending: Emergency Medicine | Admitting: Emergency Medicine

## 2011-12-15 ENCOUNTER — Encounter (HOSPITAL_COMMUNITY): Payer: Self-pay | Admitting: *Deleted

## 2011-12-15 DIAGNOSIS — R10819 Abdominal tenderness, unspecified site: Secondary | ICD-10-CM | POA: Insufficient documentation

## 2011-12-15 DIAGNOSIS — F17200 Nicotine dependence, unspecified, uncomplicated: Secondary | ICD-10-CM | POA: Insufficient documentation

## 2011-12-15 DIAGNOSIS — N390 Urinary tract infection, site not specified: Secondary | ICD-10-CM | POA: Insufficient documentation

## 2011-12-15 DIAGNOSIS — R109 Unspecified abdominal pain: Secondary | ICD-10-CM | POA: Insufficient documentation

## 2011-12-15 DIAGNOSIS — R112 Nausea with vomiting, unspecified: Secondary | ICD-10-CM | POA: Insufficient documentation

## 2011-12-15 HISTORY — DX: Alcohol abuse, uncomplicated: F10.10

## 2011-12-15 LAB — DIFFERENTIAL
Eosinophils Absolute: 0 10*3/uL (ref 0.0–0.7)
Eosinophils Relative: 0 % (ref 0–5)
Lymphocytes Relative: 16 % (ref 12–46)
Lymphs Abs: 2.1 10*3/uL (ref 0.7–4.0)
Neutro Abs: 10 10*3/uL — ABNORMAL HIGH (ref 1.7–7.7)
Neutrophils Relative %: 75 % (ref 43–77)

## 2011-12-15 LAB — COMPREHENSIVE METABOLIC PANEL
CO2: 28 mEq/L (ref 19–32)
Calcium: 10.8 mg/dL — ABNORMAL HIGH (ref 8.4–10.5)
Creatinine, Ser: 1.35 mg/dL (ref 0.50–1.35)
GFR calc Af Amer: 72 mL/min — ABNORMAL LOW (ref >90)
GFR calc non Af Amer: 62 mL/min — ABNORMAL LOW (ref >90)
Glucose, Bld: 130 mg/dL — ABNORMAL HIGH (ref 70–99)

## 2011-12-15 LAB — URINALYSIS, ROUTINE W REFLEX MICROSCOPIC
Glucose, UA: NEGATIVE mg/dL
Protein, ur: 100 mg/dL — AB

## 2011-12-15 LAB — CBC
HCT: 46.7 % (ref 39.0–52.0)
Hemoglobin: 15.9 g/dL (ref 13.0–17.0)
MCHC: 34 g/dL (ref 30.0–36.0)
RDW: 15.1 % (ref 11.5–15.5)
WBC: 13.3 10*3/uL — ABNORMAL HIGH (ref 4.0–10.5)

## 2011-12-15 LAB — URINE MICROSCOPIC-ADD ON

## 2011-12-15 LAB — LIPASE, BLOOD: Lipase: 15 U/L (ref 11–59)

## 2011-12-15 MED ORDER — HYDROMORPHONE HCL PF 1 MG/ML IJ SOLN
0.5000 mg | Freq: Once | INTRAMUSCULAR | Status: AC
  Administered 2011-12-15: 0.5 mg via INTRAVENOUS
  Filled 2011-12-15: qty 1

## 2011-12-15 MED ORDER — CEPHALEXIN 500 MG PO CAPS
500.0000 mg | ORAL_CAPSULE | Freq: Once | ORAL | Status: AC
  Administered 2011-12-15: 500 mg via ORAL
  Filled 2011-12-15: qty 1

## 2011-12-15 MED ORDER — SODIUM CHLORIDE 0.9 % IV SOLN
1000.0000 mL | Freq: Once | INTRAVENOUS | Status: AC
  Administered 2011-12-15: 1000 mL via INTRAVENOUS

## 2011-12-15 MED ORDER — CEPHALEXIN 500 MG PO CAPS: 500.0000 mg | ORAL_CAPSULE | Freq: Four times a day (QID) | ORAL | Status: AC

## 2011-12-15 MED ORDER — PANTOPRAZOLE SODIUM 40 MG IV SOLR
40.0000 mg | Freq: Once | INTRAVENOUS | Status: AC
  Administered 2011-12-15: 40 mg via INTRAVENOUS
  Filled 2011-12-15: qty 40

## 2011-12-15 MED ORDER — ONDANSETRON HCL 4 MG/2ML IJ SOLN
4.0000 mg | Freq: Once | INTRAMUSCULAR | Status: AC
  Administered 2011-12-15: 4 mg via INTRAVENOUS
  Filled 2011-12-15: qty 2

## 2011-12-15 MED ORDER — OXYCODONE-ACETAMINOPHEN 5-325 MG PO TABS: 1.0000 | ORAL_TABLET | ORAL | Status: AC | PRN

## 2011-12-15 MED ORDER — SODIUM CHLORIDE 0.9 % IV SOLN: 1000.0000 mL | INTRAVENOUS | Status: DC

## 2011-12-15 MED ORDER — ONDANSETRON HCL 4 MG PO TABS: 4.0000 mg | ORAL_TABLET | Freq: Four times a day (QID) | ORAL | Status: AC

## 2011-12-15 NOTE — ED Provider Notes (Signed)
History     CSN: [id number]  Arrival date & time 12/15/11  4098   First MD Initiated Contact with Patient 12/15/11 [phone]      Chief Complaint  Patient presents with  . Abdominal Pain  . Emesis    (Consider location/radiation/quality/duration/timing/severity/associated sxs/prior treatment) HPI Comments: [name] is a 46 y.o. male who has had several days of vomiting and epigastric discomfort, "burning", that is like pain he has had similarly, when his ulcer was bothering him. He has vomiting, food-like and liquids without blood, for several days. No diarrhea. No lower abdominal or back pain. Denies weakness or dizziness. He has used Prilosec in the past, but is not currently taking it. He continues to smoke.  Patient is a 46 y.o. male presenting with abdominal pain and vomiting.  Abdominal Pain The primary symptoms of the illness include abdominal pain and vomiting.  Emesis  Associated symptoms include abdominal pain.    Past Medical History  Diagnosis Date  . GERD (gastroesophageal reflux disease)   . Gastric ulcer   . Alcohol abuse     Past Surgical History  Procedure Date  . Appendectomy   . Hand surgery     right  . Ankle surgery     right    No family history on file.  History  Substance Use Topics  . Smoking status: Current Everyday Smoker -- 0.5 packs/day  . Smokeless tobacco: Not on file  . Alcohol Use: Yes     no drinks x 2-3 wks      Review of Systems  Gastrointestinal: Positive for vomiting and abdominal pain.  All other systems reviewed and are negative.    Allergies  Ibuprofen and Tylenol  Home Medications   Current Outpatient Rx  Name Route Sig Dispense Refill  . OMEPRAZOLE MAGNESIUM 20 MG PO TBEC Oral Take 20 mg by mouth daily.    . CEPHALEXIN 500 MG PO CAPS Oral Take 1 capsule (500 mg total) by mouth 4 (four) times daily. 28 capsule 0  . ONDANSETRON HCL 4 MG PO TABS Oral Take 1 tablet (4 mg total) by mouth every 6 (six) hours.  12 tablet 0  . OXYCODONE-ACETAMINOPHEN 5-325 MG PO TABS Oral Take 1 tablet by mouth every 4 (four) hours as needed for pain. 20 tablet 0    BP 135/67  Pulse 60  Temp(Src) 98.6 F (37 C) (Oral)  Resp 16  Wt 226 lb (102.513 kg)  SpO2 99%  Physical Exam  Nursing note and vitals reviewed. Constitutional: He is oriented to person, place, and time. He appears well-developed and well-nourished.  HENT:  Head: Normocephalic and atraumatic.  Right Ear: External ear normal.  Left Ear: External ear normal.  Eyes: Conjunctivae and EOM are normal. Pupils are equal, round, and reactive to light.  Neck: Normal range of motion and phonation normal. Neck supple.  Cardiovascular: Normal rate, regular rhythm, normal heart sounds and intact distal pulses.   Pulmonary/Chest: Effort normal and breath sounds normal. He exhibits no bony tenderness.  Abdominal: Soft. Normal appearance. He exhibits no mass. There is tenderness (Mild diffuse upper abdominal). There is no rebound and no guarding.  Musculoskeletal: Normal range of motion. He exhibits no edema and no tenderness.  Neurological: He is alert and oriented to person, place, and time. He has normal strength. No cranial nerve deficit or sensory deficit. He exhibits normal muscle tone. Coordination normal.  Skin: Skin is warm, dry and intact.  Psychiatric: He has a normal mood  and affect. His behavior is normal. Judgment and thought content normal.    ED Course  Procedures (including critical care time)  Initial treatment: IV fluids, Dilaudid, Zofran, protonix. Treated with Keflex for UTI.  4:02 PM Reevaluation with update and discussion. After initial assessment and treatment, an updated evaluation reveals . He is comfortable now . [name]    Labs Reviewed  CBC - Abnormal; Notable for the following:    WBC 13.3 (*)    RBC 6.44 (*)    MCV 72.5 (*)    MCH 24.7 (*)    All other components within normal limits  DIFFERENTIAL - Abnormal;  Notable for the following:    Neutro Abs 10.0 (*)    Monocytes Absolute 1.2 (*)    All other components within normal limits  URINALYSIS, ROUTINE W REFLEX MICROSCOPIC - Abnormal; Notable for the following:    Color, Urine AMBER (*) BIOCHEMICALS MAY BE AFFECTED BY COLOR   APPearance CLOUDY (*)    Specific Gravity, Urine 1.033 (*)    Hgb urine dipstick LARGE (*)    Bilirubin Urine SMALL (*)    Ketones, ur TRACE (*)    Protein, ur 100 (*)    Leukocytes, UA SMALL (*)    All other components within normal limits  COMPREHENSIVE METABOLIC PANEL - Abnormal; Notable for the following:    Glucose, Bld 130 (*)    BUN 24 (*)    Calcium 10.8 (*)    Total Protein 9.4 (*)    GFR calc non Af Amer 62 (*)    GFR calc Af Amer 72 (*)    All other components within normal limits  URINE MICROSCOPIC-ADD ON - Abnormal; Notable for the following:    Bacteria, UA MANY (*)    Casts HYALINE CASTS (*) GRANULAR CAST   All other components within normal limits  LIPASE, BLOOD  URINE CULTURE   No results found.   1. Abdominal pain   2. Nausea and vomiting   3. Urinary tract infection     Pt was Treated in ED with  gastric, analgesic, antiemetic, antibiotic medications with improvement; labs and imaging reviewed and considered in diagnostic decision making.  Plan: Home Medications-  Keflex , Percocet, Zofran; Home Treatments-  clear liquid diet ; Recommended follow up-  with PCP as soon as possible     MDM  Nonspecific epigastric pain, improved emergency department. Doubt appendicitis, colitis, serious bacterial  infection or metabolic instability. Likely urinary tract infection. Culture ordered.           Flint Melter, MD 12/15/11 [phone]

## 2011-12-15 NOTE — ED Notes (Signed)
Pt states "I have a hx of ulcers, I used to take carafate but can't afford it, haven't been to [facility] in a couple of years, started vomiting around 0100 Monday morning, shouldn't have drank that coffee & smoked that cigarette"; pt provided with emesis bag

## 2011-12-15 NOTE — ED Notes (Signed)
Pt medicated

## 2011-12-15 NOTE — ED Notes (Signed)
Patient given discharge instructions, information, prescriptions, and diet order. Patient states that they adequately understand discharge information given and to return to ED if symptoms return or worsen.     

## 2011-12-15 NOTE — Discharge Instructions (Signed)
Stay on a clear liquid diet for one to 2 days. Then advance to bland diet. Start the antibiotic prescription by tomorrow . See your Dr. for a checkup.  Abdominal Pain Abdominal pain can be caused by many things. Your caregiver decides the seriousness of your pain by an examination and possibly blood tests and X-rays. Many cases can be observed and treated at home. Most abdominal pain is not caused by a disease and will probably improve without treatment. However, in many cases, more time must pass before a clear cause of the pain can be found. Before that point, it may not be known if you need more testing, or if hospitalization or surgery is needed. HOME CARE INSTRUCTIONS   Do not take laxatives unless directed by your caregiver.   Take pain medicine only as directed by your caregiver.   Only take over-the-counter or prescription medicines for pain, discomfort, or fever as directed by your caregiver.   Try a clear liquid diet (broth, tea, or water) for as long as directed by your caregiver. Slowly move to a bland diet as tolerated.  SEEK IMMEDIATE MEDICAL CARE IF:   The pain does not go away.   You have a fever.   You keep throwing up (vomiting).   The pain is felt only in portions of the abdomen. Pain in the right side could possibly be appendicitis. In an adult, pain in the left lower portion of the abdomen could be colitis or diverticulitis.   You pass bloody or black tarry stools.  MAKE SURE YOU:   Understand these instructions.   Will watch your condition.   Will get help right away if you are not doing well or get worse.  Document Released: 07/15/2005 Document Revised: 06/17/2011 Document Reviewed: 05/23/2008 [facility] Patient Information 2012 [place], [place].B.R.A.T. Diet Your doctor has recommended the B.R.A.T. diet for you or your child until the condition improves. This is often used to help control diarrhea and vomiting symptoms. If you or your child can tolerate clear  liquids, you may have:  Bananas.   Rice.   Applesauce.   Toast (and other simple starches such as crackers, potatoes, noodles).  Be sure to avoid dairy products, meats, and fatty foods until symptoms are better. Fruit juices such as apple, grape, and prune juice can make diarrhea worse. Avoid these. Continue this diet for 2 days or as instructed by your caregiver. Document Released: 10/05/2005 Document Revised: 06/17/2011 Document Reviewed: 03/24/2007 [facility] Patient Information 2012 [place], [place].Clear Liquid Diet The clear liquid dietconsists of foods that are liquid or will become liquid at room temperature.You should be able to see through the liquid and beverages. Examples of foods allowed on a clear liquid diet include fruit juice, broth or bouillon, gelatin, or frozen ice pops. The purpose of this diet is to provide necessary fluid, electrolytes such a sodium and potassium, and energy to keep the body functioning during times when you are not able to consume a regular diet.A clear liquid diet should not be continued for long periods of time as it is not nutritionally adequate.  REASONS FOR USING A CLEAR LIQUID DIET  In sudden onset (acute) conditions for a patient before or after surgery.   As the first step in oral feeding.   For fluid and electrolyte replacement in diarrheal diseases.   As a diet before certain medical tests are performed.  ADEQUACY The clear liquid diet is adequate only in ascorbic acid, according to the Recommended Dietary Allowances of the  [employer]. CHOOSING FOODS Breads and Starches  Allowed:  None are allowed.   Avoid: All are avoided.  Vegetables  Allowed:  Strained tomato or vegetable juice.   Avoid: Any others.  Fruit  Allowed:  Strained fruit juices and fruit drinks. Include 1 serving of citrus or vitamin C-enriched fruit juice daily.   Avoid: Any others.  Meat and Meat Substitutes  Allowed:  None are allowed.    Avoid: All are avoided.  Milk  Allowed:  None are allowed.   Avoid: All are avoided.  Soups and Combination Foods  Allowed:  Clear bouillon, broth, or strained broth-based soups.   Avoid: Any others.  Desserts and Sweets  Allowed:  Sugar, honey. High protein gelatin. Flavored gelatin, ices, or frozen ice pops that do not contain milk.   Avoid: Any others.  Fats and Oils  Allowed:  None are allowed.   Avoid: All are avoided.  Beverages  Allowed:  Carbonated beverages, cereal beverages, coffee (regular or decaffeinated), or tea.   Avoid: Any others.  Condiments  Allowed:  Iodized salt.   Avoid: Any others, including pepper.  Supplements  Allowed:  Liquid nutrition beverages.   Avoid: Any others that contain lactose or fiber.  SAMPLE MEAL PLAN Breakfast  4 oz strained orange juice.    to 1 cup gelatin (plain or fortified).   1 cup beverage (coffee or tea).   Sugar, if desired.  Midmorning Snack   cup gelatin (plain or fortified).  Lunch  1 cup broth or consomm.   4 oz strained grapefruit juice.    cup gelatin (plain or fortified).   1 cup beverage (coffee or tea).   Sugar, if desired.  Midafternoon Snack   cup fruit ice.    cup strained fruit juice.  Dinner  1 cup broth or consomm.    cup cranberry juice.    cup flavored gelatin (plain or fortified).   1 cup beverage (coffee or tea).   Sugar, if desired.  Evening Snack  4 oz strained apple juice (vitamin C-fortified).    cup flavored gelatin (plain or fortified).  Document Released: 10/05/2005 Document Revised: 06/17/2011 Document Reviewed: 01/02/2011 [facility] Patient Information 2012 [place], [place].Urinary Tract Infection Infections of the urinary tract can start in several places. A bladder infection (cystitis), a kidney infection (pyelonephritis), and a prostate infection (prostatitis) are different types of urinary tract infections (UTIs). They usually get better if  treated with medicines (antibiotics) that kill germs. Take all the medicine until it is gone. You or your child may feel better in a few days, but TAKE ALL MEDICINE or the infection may not respond and may become more difficult to treat. HOME CARE INSTRUCTIONS   Drink enough water and fluids to keep the urine clear or pale yellow. Cranberry juice is especially recommended, in addition to large amounts of water.   Avoid caffeine, tea, and carbonated beverages. They tend to irritate the bladder.   Alcohol may irritate the prostate.   Only take over-the-counter or prescription medicines for pain, discomfort, or fever as directed by your caregiver.  To prevent further infections:  Empty the bladder often. Avoid holding urine for long periods of time.   After a bowel movement, women should cleanse from front to back. Use each tissue only once.   Empty the bladder before and after sexual intercourse.  FINDING OUT THE RESULTS OF YOUR TEST Not all test results are available during your visit. If your or your child's test results are  not back during the visit, make an appointment with your caregiver to find out the results. Do not assume everything is normal if you have not heard from your caregiver or the medical facility. It is important for you to follow up on all test results. SEEK MEDICAL CARE IF:   There is back pain.   Your baby is older than 3 months with a rectal temperature of 100.5 F (38.1 C) or higher for more than 1 day.   Your or your child's problems (symptoms) are no better in 3 days. Return sooner if you or your child is getting worse.  SEEK IMMEDIATE MEDICAL CARE IF:   There is severe back pain or lower abdominal pain.   You or your child develops chills.   You have a fever.   Your baby is older than 3 months with a rectal temperature of 102 F (38.9 C) or higher.   Your baby is 70 months old or younger with a rectal temperature of 100.4 F (38 C) or higher.   There  is nausea or vomiting.   There is continued burning or discomfort with urination.  MAKE SURE YOU:   Understand these instructions.   Will watch your condition.   Will get help right away if you are not doing well or get worse.  Document Released: 07/15/2005 Document Revised: 06/17/2011 Document Reviewed: 02/17/2007 [facility] Patient Information 2012 [place], [place].

## 2011-12-16 LAB — URINE CULTURE
Colony Count: NO GROWTH
Culture  Setup Time: 201302262217

## 2012-01-28 ENCOUNTER — Emergency Department (HOSPITAL_COMMUNITY)
Admission: EM | Admit: 2012-01-28 | Discharge: 2012-01-28 | Disposition: A | Discharge status: Home / Self Care | Payer: Self-pay | Attending: Emergency Medicine | Admitting: Emergency Medicine

## 2012-01-28 ENCOUNTER — Encounter (HOSPITAL_COMMUNITY): Payer: Self-pay | Admitting: Emergency Medicine

## 2012-01-28 DIAGNOSIS — F17200 Nicotine dependence, unspecified, uncomplicated: Secondary | ICD-10-CM | POA: Insufficient documentation

## 2012-01-28 DIAGNOSIS — M25569 Pain in unspecified knee: Secondary | ICD-10-CM | POA: Insufficient documentation

## 2012-01-28 DIAGNOSIS — K219 Gastro-esophageal reflux disease without esophagitis: Secondary | ICD-10-CM | POA: Insufficient documentation

## 2012-01-28 DIAGNOSIS — M25562 Pain in left knee: Secondary | ICD-10-CM

## 2012-01-28 MED ORDER — OXYCODONE HCL 5 MG PO TABS: 5.0000 mg | ORAL_TABLET | ORAL | Status: AC | PRN

## 2012-01-28 NOTE — Discharge Instructions (Signed)
Knee Pain The knee is the complex joint between your thigh and your lower leg. It is made up of bones, tendons, ligaments, and cartilage. The bones that make up the knee are:  The femur in the thigh.   The tibia and fibula in the lower leg.   The patella or kneecap riding in the groove on the lower femur.  CAUSES  Knee pain is a common complaint with many causes. A few of these causes are:  Injury, such as:   A ruptured ligament or tendon injury.   Torn cartilage.   Medical conditions, such as:   Gout   Arthritis   Infections   Overuse, over training or overdoing a physical activity.  Knee pain can be minor or severe. Knee pain can accompany debilitating injury. Minor knee problems often respond well to self-care measures or get well on their own. More serious injuries may need medical intervention or even surgery. SYMPTOMS The knee is complex. Symptoms of knee problems can vary widely. Some of the problems are:  Pain with movement and weight bearing.   Swelling and tenderness.   Buckling of the knee.   Inability to straighten or extend your knee.   Your knee locks and you cannot straighten it.   Warmth and redness with pain and fever.   Deformity or dislocation of the kneecap.  DIAGNOSIS  Determining what is wrong may be very straight forward such as when there is an injury. It can also be challenging because of the complexity of the knee. Tests to make a diagnosis may include:  Your caregiver taking a history and doing a physical exam.   Routine X-rays can be used to rule out other problems. X-rays will not reveal a cartilage tear. Some injuries of the knee can be diagnosed by:   Arthroscopy a surgical technique by which a small video camera is inserted through tiny incisions on the sides of the knee. This procedure is used to examine and repair internal knee joint problems. Tiny instruments can be used during arthroscopy to repair the torn knee cartilage  (meniscus).   Arthrography is a radiology technique. A contrast liquid is directly injected into the knee joint. Internal structures of the knee joint then become visible on X-ray film.   An MRI scan is a non x-ray radiology procedure in which magnetic fields and a computer produce two- or three-dimensional images of the inside of the knee. Cartilage tears are often visible using an MRI scanner. MRI scans have largely replaced arthrography in diagnosing cartilage tears of the knee.   Blood work.   Examination of the fluid that helps to lubricate the knee joint (synovial fluid). This is done by taking a sample out using a needle and a syringe.  TREATMENT The treatment of knee problems depends on the cause. Some of these treatments are:  Depending on the injury, proper casting, splinting, surgery or physical therapy care will be needed.   Give yourself adequate recovery time. Do not overuse your joints. If you begin to get sore during workout routines, back off. Slow down or do fewer repetitions.   For repetitive activities such as cycling or running, maintain your strength and nutrition.   Alternate muscle groups. For example if you are a weight lifter, work the upper body on one day and the lower body the next.   Either tight or weak muscles do not give the proper support for your knee. Tight or weak muscles do not absorb the stress placed   on the knee joint. Keep the muscles surrounding the knee strong.   Take care of mechanical problems.   If you have flat feet, orthotics or special shoes may help. See your caregiver if you need help.   Arch supports, sometimes with wedges on the inner or outer aspect of the heel, can help. These can shift pressure away from the side of the knee most bothered by osteoarthritis.   A brace called an "unloader" brace also may be used to help ease the pressure on the most arthritic side of the knee.   If your caregiver has prescribed crutches, braces,  wraps or ice, use as directed. The acronym for this is PRICE. This means protection, rest, ice, compression and elevation.   Nonsteroidal anti-inflammatory drugs (NSAID's), can help relieve pain. But if taken immediately after an injury, they may actually increase swelling. Take NSAID's with food in your stomach. Stop them if you develop stomach problems. Do not take these if you have a history of ulcers, stomach pain or bleeding from the bowel. Do not take without your caregiver's approval if you have problems with fluid retention, heart failure, or kidney problems.   For ongoing knee problems, physical therapy may be helpful.   Glucosamine and chondroitin are over-the-counter dietary supplements. Both may help relieve the pain of osteoarthritis in the knee. These medicines are different from the usual anti-inflammatory drugs. Glucosamine may decrease the rate of cartilage destruction.   Injections of a corticosteroid drug into your knee joint may help reduce the symptoms of an arthritis flare-up. They may provide pain relief that lasts a few months. You may have to wait a few months between injections. The injections do have a small increased risk of infection, water retention and elevated blood sugar levels.   Hyaluronic acid injected into damaged joints may ease pain and provide lubrication. These injections may work by reducing inflammation. A series of shots may give relief for as long as 6 months.   Topical painkillers. Applying certain ointments to your skin may help relieve the pain and stiffness of osteoarthritis. Ask your pharmacist for suggestions. Many over the-counter products are approved for temporary relief of arthritis pain.   In some countries, doctors often prescribe topical NSAID's for relief of chronic conditions such as arthritis and tendinitis. A review of treatment with NSAID creams found that they worked as well as oral medications but without the serious side effects.    PREVENTION  Maintain a healthy weight. Extra pounds put more strain on your joints.   Get strong, stay limber. Weak muscles are a common cause of knee injuries. Stretching is important. Include flexibility exercises in your workouts.   Be smart about exercise. If you have osteoarthritis, chronic knee pain or recurring injuries, you may need to change the way you exercise. This does not mean you have to stop being active. If your knees ache after jogging or playing basketball, consider switching to swimming, water aerobics or other low-impact activities, at least for a few days a week. Sometimes limiting high-impact activities will provide relief.   Make sure your shoes fit well. Choose footwear that is right for your sport.   Protect your knees. Use the proper gear for knee-sensitive activities. Use kneepads when playing volleyball or laying carpet. Buckle your seat belt every time you drive. Most shattered kneecaps occur in car accidents.   Rest when you are tired.  SEEK MEDICAL CARE IF:  You have knee pain that is continual and does not   seem to be getting better.  SEEK IMMEDIATE MEDICAL CARE IF:  Your knee joint feels hot to the touch and you have a high fever. MAKE SURE YOU:   Understand these instructions.   Will watch your condition.   Will get help right away if you are not doing well or get worse.  Document Released: 08/02/2007 Document Revised: 09/24/2011 Document Reviewed: 08/02/2007 [employer] Patient Information 2012 [employer]. 

## 2012-01-28 NOTE — ED Notes (Signed)
Pt reports 2-4 month hx of knee pain. Did not follow up with ortho referal, pain had decreased
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
## 2012-01-28 NOTE — Progress Notes (Signed)
Pt listed as self pay with no insurance coverage Pt confirms he is self pay [place] resident CM and [facility] community liaison spoke with him Pt offered [facility] services to assist with finding a [place] self pay provider Pt accepted information   

## 2012-01-28 NOTE — ED Notes (Signed)
Pt called out requesting pain med-Stephanie EDPA notified

## 2012-01-28 NOTE — ED Provider Notes (Signed)
Medical screening examination/treatment/procedure(s) were performed by non-physician practitioner and as supervising physician I was immediately available for consultation/collaboration.   Laray Anger, DO 01/28/12 1934

## 2012-02-07 ENCOUNTER — Encounter (HOSPITAL_COMMUNITY): Payer: Self-pay

## 2012-02-07 ENCOUNTER — Inpatient Hospital Stay (HOSPITAL_COMMUNITY)
Admission: EM | Admit: 2012-02-07 | Discharge: 2012-02-08 | DRG: 641 | Disposition: A | Discharge status: Home / Self Care | Payer: Medicaid Other | Attending: Internal Medicine | Admitting: Internal Medicine

## 2012-02-07 ENCOUNTER — Emergency Department (HOSPITAL_COMMUNITY): Payer: Medicaid Other

## 2012-02-07 DIAGNOSIS — K319 Disease of stomach and duodenum, unspecified: Secondary | ICD-10-CM | POA: Diagnosis present

## 2012-02-07 DIAGNOSIS — Z8711 Personal history of peptic ulcer disease: Secondary | ICD-10-CM

## 2012-02-07 DIAGNOSIS — K2970 Gastritis, unspecified, without bleeding: Secondary | ICD-10-CM | POA: Diagnosis present

## 2012-02-07 DIAGNOSIS — F17200 Nicotine dependence, unspecified, uncomplicated: Secondary | ICD-10-CM | POA: Diagnosis present

## 2012-02-07 DIAGNOSIS — E876 Hypokalemia: Secondary | ICD-10-CM | POA: Diagnosis present

## 2012-02-07 DIAGNOSIS — K279 Peptic ulcer, site unspecified, unspecified as acute or chronic, without hemorrhage or perforation: Secondary | ICD-10-CM | POA: Diagnosis present

## 2012-02-07 DIAGNOSIS — K219 Gastro-esophageal reflux disease without esophagitis: Secondary | ICD-10-CM | POA: Diagnosis present

## 2012-02-07 DIAGNOSIS — F1210 Cannabis abuse, uncomplicated: Secondary | ICD-10-CM | POA: Diagnosis present

## 2012-02-07 DIAGNOSIS — R112 Nausea with vomiting, unspecified: Secondary | ICD-10-CM | POA: Diagnosis present

## 2012-02-07 DIAGNOSIS — K2990 Gastroduodenitis, unspecified, without bleeding: Secondary | ICD-10-CM | POA: Diagnosis present

## 2012-02-07 DIAGNOSIS — E860 Dehydration: Principal | ICD-10-CM | POA: Diagnosis present

## 2012-02-07 LAB — COMPREHENSIVE METABOLIC PANEL
ALT: 17 U/L (ref 0–53)
AST: 19 U/L (ref 0–37)
Calcium: 9.2 mg/dL (ref 8.4–10.5)
GFR calc Af Amer: >90 mL/min (ref >90)
Glucose, Bld: 113 mg/dL — ABNORMAL HIGH (ref 70–99)
Sodium: 139 mEq/L (ref 135–145)
Total Protein: 7.6 g/dL (ref 6.0–8.3)

## 2012-02-07 LAB — DIFFERENTIAL
Basophils Relative: 0 % (ref 0–1)
Lymphs Abs: 2.1 10*3/uL (ref 0.7–4.0)
Monocytes Absolute: 0.7 10*3/uL (ref 0.1–1.0)
Monocytes Relative: 8 % (ref 3–12)
Neutro Abs: 5.9 10*3/uL (ref 1.7–7.7)

## 2012-02-07 LAB — CBC
HCT: 44.7 % (ref 39.0–52.0)
Hemoglobin: 15 g/dL (ref 13.0–17.0)
MCHC: 33.6 g/dL (ref 30.0–36.0)
RBC: 6.19 MIL/uL — ABNORMAL HIGH (ref 4.22–5.81)

## 2012-02-07 MED ORDER — ONDANSETRON HCL 4 MG/2ML IJ SOLN
4.0000 mg | Freq: Once | INTRAMUSCULAR | Status: AC
  Administered 2012-02-07: 4 mg via INTRAVENOUS
  Filled 2012-02-07: qty 2

## 2012-02-07 MED ORDER — MORPHINE SULFATE 4 MG/ML IJ SOLN
4.0000 mg | INTRAMUSCULAR | Status: DC | PRN
  Administered 2012-02-07 – 2012-02-08 (×4): 4 mg via INTRAVENOUS
  Filled 2012-02-07 (×4): qty 1

## 2012-02-07 MED ORDER — ENOXAPARIN SODIUM 40 MG/0.4ML ~~LOC~~ SOLN
40.0000 mg | SUBCUTANEOUS | Status: DC
  Filled 2012-02-07 (×3): qty 0.4

## 2012-02-07 MED ORDER — ONDANSETRON HCL 4 MG/2ML IJ SOLN
4.0000 mg | Freq: Four times a day (QID) | INTRAMUSCULAR | Status: DC | PRN
  Administered 2012-02-07 (×2): 4 mg via INTRAVENOUS
  Filled 2012-02-07 (×3): qty 2

## 2012-02-07 MED ORDER — FENTANYL CITRATE 0.05 MG/ML IJ SOLN
100.0000 ug | Freq: Once | INTRAMUSCULAR | Status: AC
  Administered 2012-02-07: 100 ug via INTRAVENOUS
  Filled 2012-02-07: qty 2

## 2012-02-07 MED ORDER — POTASSIUM CHLORIDE 10 MEQ/100ML IV SOLN
10.0000 meq | INTRAVENOUS | Status: AC
  Administered 2012-02-07 (×2): 10 meq via INTRAVENOUS
  Filled 2012-02-07 (×2): qty 100

## 2012-02-07 MED ORDER — SODIUM CHLORIDE 0.9 % IV BOLUS (SEPSIS)
1000.0000 mL | Freq: Once | INTRAVENOUS | Status: AC
  Administered 2012-02-07: 1000 mL via INTRAVENOUS

## 2012-02-07 MED ORDER — PNEUMOCOCCAL VAC POLYVALENT 25 MCG/0.5ML IJ INJ
0.5000 mL | INJECTION | INTRAMUSCULAR | Status: AC
  Administered 2012-02-08: 0.5 mL via INTRAMUSCULAR
  Filled 2012-02-07: qty 0.5

## 2012-02-07 MED ORDER — POTASSIUM CHLORIDE 10 MEQ/100ML IV SOLN
10.0000 meq | Freq: Once | INTRAVENOUS | Status: AC
  Administered 2012-02-07: 10 meq via INTRAVENOUS
  Filled 2012-02-07: qty 100

## 2012-02-07 MED ORDER — THIAMINE HCL 100 MG/ML IJ SOLN
100.0000 mg | Freq: Once | INTRAMUSCULAR | Status: AC
  Administered 2012-02-07: 100 mg via INTRAVENOUS
  Filled 2012-02-07: qty 2

## 2012-02-07 MED ORDER — PANTOPRAZOLE SODIUM 40 MG IV SOLR
40.0000 mg | Freq: Once | INTRAVENOUS | Status: AC
  Administered 2012-02-07: 40 mg via INTRAVENOUS
  Filled 2012-02-07: qty 40

## 2012-02-07 MED ORDER — PANTOPRAZOLE SODIUM 40 MG IV SOLR
40.0000 mg | INTRAVENOUS | Status: DC
  Administered 2012-02-07: 40 mg via INTRAVENOUS
  Filled 2012-02-07 (×2): qty 40

## 2012-02-07 MED ORDER — THIAMINE HCL 100 MG/ML IJ SOLN
Freq: Once | INTRAVENOUS | Status: AC
  Administered 2012-02-07: 16:00:00 via INTRAVENOUS
  Filled 2012-02-07: qty 1000

## 2012-02-07 MED ORDER — ONDANSETRON HCL 4 MG PO TABS: 4.0000 mg | ORAL_TABLET | Freq: Four times a day (QID) | ORAL | Status: DC | PRN

## 2012-02-07 MED ORDER — SODIUM CHLORIDE 0.9 % IV SOLN
INTRAVENOUS | Status: DC
  Administered 2012-02-08 (×2): via INTRAVENOUS
  Filled 2012-02-07 (×5): qty 1000

## 2012-02-07 MED ORDER — SODIUM CHLORIDE 0.9 % IJ SOLN: 3.0000 mL | Freq: Two times a day (BID) | INTRAMUSCULAR | Status: DC

## 2012-02-07 MED ORDER — GI COCKTAIL ~~LOC~~
30.0000 mL | Freq: Once | ORAL | Status: AC
  Administered 2012-02-07: 30 mL via ORAL
  Filled 2012-02-07: qty 30

## 2012-02-07 NOTE — ED Notes (Signed)
Report called to Bonnie,RN

## 2012-02-07 NOTE — ED Notes (Signed)
Pt in from home with c/o n/v/ and abd pain since Friday states hx of [name] denies diarrhea states pain is generalized abd
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
## 2012-02-08 LAB — BASIC METABOLIC PANEL
Calcium: 9.2 mg/dL (ref 8.4–10.5)
Creatinine, Ser: 0.85 mg/dL (ref 0.50–1.35)
GFR calc Af Amer: >90 mL/min (ref >90)
GFR calc non Af Amer: >90 mL/min (ref >90)
Sodium: 136 mEq/L (ref 135–145)

## 2012-02-08 MED ORDER — OXYCODONE HCL 5 MG PO TABS: 5.0000 mg | ORAL_TABLET | ORAL | Status: AC | PRN

## 2012-02-08 MED ORDER — OXYCODONE HCL 5 MG PO TABS: 5.0000 mg | ORAL_TABLET | ORAL | Status: DC | PRN

## 2012-02-08 MED ORDER — RANITIDINE HCL 75 MG PO TABS: 75.0000 mg | ORAL_TABLET | Freq: Two times a day (BID) | ORAL | Status: DC | PRN

## 2012-02-08 MED ORDER — OMEPRAZOLE MAGNESIUM 20 MG PO TBEC: 20.0000 mg | DELAYED_RELEASE_TABLET | Freq: Every day | ORAL | Status: DC

## 2012-02-08 MED ORDER — PANTOPRAZOLE SODIUM 40 MG PO TBEC
40.0000 mg | DELAYED_RELEASE_TABLET | Freq: Two times a day (BID) | ORAL | Status: DC
  Filled 2012-02-08 (×2): qty 1

## 2012-02-08 NOTE — Progress Notes (Signed)
CARE MANAGEMENT NOTE 02/08/2012  Patient:  [name], [name]   Account Number:  [id number]  Date Initiated:  02/08/2012  Documentation initiated by:  Marget Outten  Subjective/Objective Assessment:   pt with nausea and vomiting x several days, abd pain,     Action/Plan:   lives at home   Anticipated DC Date:  02/11/2012   Anticipated DC Plan:  HOME/SELF CARE  In-house referral  NA      DC Planning Services  NA      [facility] Choice  NA   Choice offered to / List presented to:  NA   DME arranged  NA      DME agency  NA     HH arranged  NA      HH agency  NA   Status of service:  In process, will continue to follow Medicare Important Message given?  NA - LOS <3 / Initial given by admissions (If response is "NO", the following Medicare IM given date fields will be blank) Date Medicare IM given:   Date Additional Medicare IM given:    Discharge Disposition:    Per UR Regulation:  Reviewed for med. necessity/level of care/duration of stay  If discussed at Long Length of Stay Meetings, dates discussed:    Comments:  [id number]/Ethanael Veith Lorrin Mais Case Management [phone]

## 2012-02-08 NOTE — Discharge Summary (Signed)
[name] MRN: [id number] DOB/AGE: 04/04/1966 46 y.o.  Admit date: 02/07/2012 Discharge date: 02/08/2012  Primary Care Physician:  Provider Not In System   Discharge Diagnoses:   Patient Active Problem List  Diagnoses  . Hypokalemia  . Nausea and vomiting in adult  . Dehydration  . PUD (peptic ulcer disease)    DISCHARGE MEDICATION: Medication List  As of 02/08/2012  5:08 PM   TAKE these medications         omeprazole 20 MG tablet   Commonly known as: PRILOSEC OTC   Take 1 tablet (20 mg total) by mouth daily.      oxyCODONE 5 MG immediate release tablet   Commonly known as: Oxy IR/ROXICODONE   Take 1 tablet (5 mg total) by mouth every 4 (four) hours as needed for pain.      ranitidine 75 MG tablet   Commonly known as: ZANTAC   Take 1 tablet (75 mg total) by mouth 2 (two) times daily as needed. For acid reflux              Consults:     SIGNIFICANT DIAGNOSTIC STUDIES:  Dg Abd Acute W/chest  02/07/2012  *RADIOLOGY REPORT*  Clinical Data: Abdominal pain and vomiting.  ACUTE ABDOMEN SERIES (ABDOMEN 2 VIEW & CHEST 1 VIEW)  Comparison: Chest x-ray 12/30/2009  Findings: Cardiomediastinal silhouette is within normal limits. The lungs are free of focal consolidations and pleural effusions.  No free intraperitoneal air beneath the diaphragm.  Supine and erect views show a nonobstructive bowel gas pattern.  No evidence for organomegaly. Visualized osseous structures have a normal appearance.  IMPRESSION:  1. No evidence for acute cardiopulmonary 2.  Nonobstructive bowel gas pattern. abnormality.  Original Report Authenticated By: Patterson Hammersmith, M.D.    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 The patient states that approximately 4 days ago he started having vomiting and abdominal pain after taking 2 Aleve on an empty stomach and which he describes as diffuse. However his most prominent area of pain is  in the right upper quadrant. He states that his vomiting is nonbilious non-hematemesis and he's been unable to tolerate anything orally. The patient states that he regularly takes Pepcid or Zantac over-the-counter medications. He has been followed in the past by gastroenterology however due to financial restraints has not been able to followup on with a regular basis. He also has not been able to follow with primary care physician on regular basis due to the same problem. 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  He denies any fever, chills, chest pain, dizziness, dysuria, frequency, weight loss, cough, heat or cold intolerance.    Hospital Course:  Present on Admission:  .Hypokalemia: The patient's potassium was 2.8 and was replaced by IV. I suspect this is secondary to GI losses of potassium. Potassium was within normal range at time of discharge.  .Nausea and vomiting in adult:The patient has had nausea and vomiting which is likely secondary to gastritis. He was given bowel rest and then diet was advanced as tolerated to full liquids. Pt does not wan to advance his diet any further as he prefers to be on full liquids for a few days per her preference.   .Dehydration: The patient has had nausea and vomiting for several days and presented with mild dehydration. He was resuscitated with IV fluids  and is now euvolemic.  Marland KitchenPUD (peptic ulcer disease): Will resume Zantac after 8 weeks of Prilosec for gastritis.  Disposition and Follow-up:   Pt to follow up with his PMD as needed post discharge.   Discharge Orders    Future Orders Please Complete By Expires   Diet - low sodium heart healthy      Activity as tolerated - No restrictions         DISCHARGE EXAM:  General: Alert, awake, oriented x3, in no acute distress. Vital Signs: Blood pressure 132/74, pulse 55, temperature 98.6 F (37 C), temperature source Oral, resp. rate 20,  height 6' (1.829 m), weight 101.152 kg (223 lb), SpO2 100.00%. HEENT: [place]/AT PEERL, EOMI  Neck: Trachea midline, no masses, no thyromegal,y no JVD, no carotid bruit  OROPHARYNX: Moist, No exudate/ erythema/lesions.  Heart: Regular rate and rhythm, without murmurs, rubs, gallops, PMI non-displaced, no heaves or thrills on palpation.  Lungs: Clear to auscultation, no wheezing or rhonchi noted. No increased vocal fremitus resonant to percussion  Abdomen: Soft, non-tender, nondistended, positive bowel sounds, no masses no hepatosplenomegaly noted..  Neuro: No focal neurological deficits noted cranial nerves II through XII grossly intact. Strength functional n bilateral upper and lower extremities.    Basename 02/08/12 0453 02/07/12 1115  NA 136 139  K 3.6 2.8*  CL 102 99  CO2 25 29  GLUCOSE 95 113*  BUN 16 25*  CREATININE 0.85 0.86  CALCIUM 9.2 9.2  MG -- --  PHOS -- --    Basename 02/07/12 1115  AST 19  ALT 17  ALKPHOS 60  BILITOT 0.6  PROT 7.6  ALBUMIN 4.2    Basename 02/07/12 1115  LIPASE 60*  AMYLASE --    Basename 02/07/12 1115  WBC 8.7  NEUTROABS 5.9  HGB 15.0  HCT 44.7  MCV 72.2*  PLT 332   Total time for D/C process approximately 35 minutes including face to face time. Signed: Jaime Grizzell A. 02/08/2012, 5:08 PM

## 2012-05-16 ENCOUNTER — Encounter (HOSPITAL_COMMUNITY): Payer: Self-pay | Admitting: Emergency Medicine

## 2012-05-16 ENCOUNTER — Emergency Department (HOSPITAL_COMMUNITY)
Admission: EM | Admit: 2012-05-16 | Discharge: 2012-05-16 | Disposition: A | Discharge status: Home / Self Care | Payer: Self-pay | Attending: Emergency Medicine | Admitting: Emergency Medicine

## 2012-05-16 DIAGNOSIS — K219 Gastro-esophageal reflux disease without esophagitis: Secondary | ICD-10-CM | POA: Insufficient documentation

## 2012-05-16 DIAGNOSIS — K047 Periapical abscess without sinus: Secondary | ICD-10-CM | POA: Insufficient documentation

## 2012-05-16 DIAGNOSIS — K0889 Other specified disorders of teeth and supporting structures: Secondary | ICD-10-CM

## 2012-05-16 DIAGNOSIS — K029 Dental caries, unspecified: Secondary | ICD-10-CM | POA: Insufficient documentation

## 2012-05-16 DIAGNOSIS — F17200 Nicotine dependence, unspecified, uncomplicated: Secondary | ICD-10-CM | POA: Insufficient documentation

## 2012-05-16 MED ORDER — PENICILLIN V POTASSIUM 500 MG PO TABS: 500.0000 mg | ORAL_TABLET | Freq: Three times a day (TID) | ORAL | Status: AC

## 2012-05-16 MED ORDER — OXYCODONE HCL 5 MG PO TABA: 5.0000 mg | ORAL_TABLET | Freq: Four times a day (QID) | ORAL | Status: DC | PRN
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
## 2012-05-16 NOTE — ED Notes (Signed)
Pt presenting to ed with c/o toothache pain x 2 days

## 2012-05-18 NOTE — ED Provider Notes (Signed)
Medical screening examination/treatment/procedure(s) were performed by non-physician practitioner and as supervising physician I was immediately available for consultation/collaboration.  Erlin Gardella, MD 05/18/12 0007 

## 2012-05-29 ENCOUNTER — Encounter (HOSPITAL_COMMUNITY): Payer: Self-pay | Admitting: Emergency Medicine

## 2012-05-29 ENCOUNTER — Emergency Department (HOSPITAL_COMMUNITY)
Admission: EM | Admit: 2012-05-29 | Discharge: 2012-05-30 | Disposition: A | Discharge status: Home / Self Care | Payer: Medicaid Other | Attending: Emergency Medicine | Admitting: Emergency Medicine

## 2012-05-29 DIAGNOSIS — R10816 Epigastric abdominal tenderness: Secondary | ICD-10-CM | POA: Insufficient documentation

## 2012-05-29 DIAGNOSIS — R112 Nausea with vomiting, unspecified: Secondary | ICD-10-CM

## 2012-05-29 DIAGNOSIS — K2970 Gastritis, unspecified, without bleeding: Secondary | ICD-10-CM | POA: Insufficient documentation

## 2012-05-29 DIAGNOSIS — K2990 Gastroduodenitis, unspecified, without bleeding: Secondary | ICD-10-CM | POA: Insufficient documentation

## 2012-05-29 DIAGNOSIS — Z8711 Personal history of peptic ulcer disease: Secondary | ICD-10-CM | POA: Insufficient documentation

## 2012-05-29 LAB — CBC WITH DIFFERENTIAL/PLATELET
Basophils Relative: 0 % (ref 0–1)
Eosinophils Relative: 0 % (ref 0–5)
HCT: 42.3 % (ref 39.0–52.0)
Hemoglobin: 13.9 g/dL (ref 13.0–17.0)
Lymphocytes Relative: 10 % — ABNORMAL LOW (ref 12–46)
MCHC: 32.9 g/dL (ref 30.0–36.0)
MCV: 73.1 fL — ABNORMAL LOW (ref 78.0–100.0)
Monocytes Absolute: 0.3 10*3/uL (ref 0.1–1.0)
Monocytes Relative: 3 % (ref 3–12)
Neutro Abs: 9.6 10*3/uL — ABNORMAL HIGH (ref 1.7–7.7)

## 2012-05-29 LAB — URINALYSIS, ROUTINE W REFLEX MICROSCOPIC
Ketones, ur: 15 mg/dL — AB
Leukocytes, UA: NEGATIVE
Nitrite: NEGATIVE
Protein, ur: 30 mg/dL — AB
Urobilinogen, UA: 0.2 mg/dL (ref 0.0–1.0)

## 2012-05-29 LAB — URINE MICROSCOPIC-ADD ON

## 2012-05-29 LAB — COMPREHENSIVE METABOLIC PANEL
BUN: 8 mg/dL (ref 6–23)
CO2: 30 mEq/L (ref 19–32)
Chloride: 103 mEq/L (ref 96–112)
Creatinine, Ser: 0.67 mg/dL (ref 0.50–1.35)
GFR calc non Af Amer: >90 mL/min (ref >90)
Total Bilirubin: 0.4 mg/dL (ref 0.3–1.2)

## 2012-05-29 LAB — LIPASE, BLOOD: Lipase: 14 U/L (ref 11–59)

## 2012-05-29 MED ORDER — SODIUM CHLORIDE 0.9 % IV SOLN
1000.0000 mL | Freq: Once | INTRAVENOUS | Status: AC
  Administered 2012-05-29: 1000 mL via INTRAVENOUS

## 2012-05-29 MED ORDER — METOCLOPRAMIDE HCL 5 MG/ML IJ SOLN
10.0000 mg | Freq: Once | INTRAMUSCULAR | Status: AC
  Administered 2012-05-29: 10 mg via INTRAVENOUS
  Filled 2012-05-29: qty 2

## 2012-05-29 MED ORDER — MORPHINE SULFATE 4 MG/ML IJ SOLN
4.0000 mg | Freq: Once | INTRAMUSCULAR | Status: AC
  Administered 2012-05-29: 4 mg via INTRAVENOUS
  Filled 2012-05-29: qty 1

## 2012-05-29 MED ORDER — PROMETHAZINE HCL 25 MG/ML IJ SOLN
12.5000 mg | Freq: Once | INTRAMUSCULAR | Status: AC
  Administered 2012-05-29: 12.5 mg via INTRAVENOUS
  Filled 2012-05-29: qty 1

## 2012-05-29 MED ORDER — PANTOPRAZOLE SODIUM 40 MG IV SOLR
40.0000 mg | Freq: Once | INTRAVENOUS | Status: AC
  Administered 2012-05-29: 40 mg via INTRAVENOUS
  Filled 2012-05-29: qty 40

## 2012-05-29 MED ORDER — ONDANSETRON HCL 4 MG/2ML IJ SOLN
4.0000 mg | Freq: Once | INTRAMUSCULAR | Status: AC
  Administered 2012-05-29: 4 mg via INTRAVENOUS
  Filled 2012-05-29: qty 2

## 2012-05-29 MED ORDER — GI COCKTAIL ~~LOC~~
30.0000 mL | Freq: Once | ORAL | Status: AC
  Administered 2012-05-29: 30 mL via ORAL
  Filled 2012-05-29: qty 30

## 2012-05-29 MED ORDER — SODIUM CHLORIDE 0.9 % IV SOLN: 1000.0000 mL | INTRAVENOUS | Status: DC

## 2012-05-29 MED ORDER — DIPHENHYDRAMINE HCL 50 MG/ML IJ SOLN
50.0000 mg | Freq: Once | INTRAMUSCULAR | Status: AC
  Administered 2012-05-29: 50 mg via INTRAVENOUS
  Filled 2012-05-29: qty 1

## 2012-05-29 MED ORDER — PROMETHAZINE HCL 25 MG/ML IJ SOLN
25.0000 mg | Freq: Once | INTRAMUSCULAR | Status: AC
  Administered 2012-05-29: 25 mg via INTRAVENOUS
  Filled 2012-05-29: qty 1

## 2012-05-29 MED ORDER — ONDANSETRON 4 MG PO TBDP
4.0000 mg | ORAL_TABLET | Freq: Once | ORAL | Status: AC
  Administered 2012-05-29: 4 mg via ORAL
  Filled 2012-05-29: qty 1

## 2012-05-29 NOTE — ED Notes (Signed)
Pt states that at 0230 this morning, he woke up with diarrhea.  He laid down for another 30 mins and started vomiting.  Has been vomiting ever since.  C/o abdominal pain 8/10.  Pt vomiting during triage.

## 2012-05-29 NOTE — ED Notes (Signed)
Pt was made aware that a UA specimen was needed. Pt stated that he was unable to void at this time but would try once the IV fluids were transfused.
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
## 2012-05-30 MED ORDER — PROMETHAZINE HCL 25 MG RE SUPP: RECTAL | Status: DC

## 2012-05-30 NOTE — ED Notes (Signed)
Pt states unable to afford phenergan supp; prescription for reglan 10mg  po q6h prn, dispense 10, no refills per Langley Adie, pa-c called in to [facility] at [phone].

## 2012-05-31 ENCOUNTER — Encounter (HOSPITAL_COMMUNITY): Payer: Self-pay | Admitting: Emergency Medicine

## 2012-05-31 ENCOUNTER — Observation Stay (HOSPITAL_COMMUNITY)
Admission: EM | Admit: 2012-05-31 | Discharge: 2012-06-01 | Disposition: A | Discharge status: Home / Self Care | Payer: Medicaid Other | Attending: Internal Medicine | Admitting: Internal Medicine

## 2012-05-31 DIAGNOSIS — I10 Essential (primary) hypertension: Secondary | ICD-10-CM | POA: Diagnosis present

## 2012-05-31 DIAGNOSIS — R1110 Vomiting, unspecified: Secondary | ICD-10-CM

## 2012-05-31 DIAGNOSIS — Z79899 Other long term (current) drug therapy: Secondary | ICD-10-CM | POA: Insufficient documentation

## 2012-05-31 DIAGNOSIS — Z9119 Patient's noncompliance with other medical treatment and regimen: Secondary | ICD-10-CM | POA: Insufficient documentation

## 2012-05-31 DIAGNOSIS — E860 Dehydration: Principal | ICD-10-CM | POA: Diagnosis present

## 2012-05-31 DIAGNOSIS — R112 Nausea with vomiting, unspecified: Secondary | ICD-10-CM | POA: Diagnosis present

## 2012-05-31 DIAGNOSIS — Z91199 Patient's noncompliance with other medical treatment and regimen due to unspecified reason: Secondary | ICD-10-CM | POA: Insufficient documentation

## 2012-05-31 DIAGNOSIS — K279 Peptic ulcer, site unspecified, unspecified as acute or chronic, without hemorrhage or perforation: Secondary | ICD-10-CM | POA: Diagnosis present

## 2012-05-31 DIAGNOSIS — Z598 Other problems related to housing and economic circumstances: Secondary | ICD-10-CM | POA: Insufficient documentation

## 2012-05-31 DIAGNOSIS — E876 Hypokalemia: Secondary | ICD-10-CM | POA: Insufficient documentation

## 2012-05-31 DIAGNOSIS — K219 Gastro-esophageal reflux disease without esophagitis: Secondary | ICD-10-CM | POA: Insufficient documentation

## 2012-05-31 DIAGNOSIS — F1010 Alcohol abuse, uncomplicated: Secondary | ICD-10-CM

## 2012-05-31 DIAGNOSIS — Z5987 Material hardship due to limited financial resources, not elsewhere classified: Secondary | ICD-10-CM | POA: Insufficient documentation

## 2012-05-31 LAB — HEPATIC FUNCTION PANEL
AST: 19 U/L (ref 0–37)
Albumin: 4.5 g/dL (ref 3.5–5.2)
Bilirubin, Direct: 0.1 mg/dL (ref 0.0–0.3)

## 2012-05-31 LAB — COMPREHENSIVE METABOLIC PANEL
ALT: 16 U/L (ref 0–53)
AST: 19 U/L (ref 0–37)
Calcium: 10 mg/dL (ref 8.4–10.5)
GFR calc Af Amer: >90 mL/min (ref >90)
Glucose, Bld: 114 mg/dL — ABNORMAL HIGH (ref 70–99)
Sodium: 138 mEq/L (ref 135–145)
Total Protein: 8 g/dL (ref 6.0–8.3)

## 2012-05-31 LAB — CARDIAC PANEL(CRET KIN+CKTOT+MB+TROPI)
Relative Index: 0.6 (ref 0.0–2.5)
Total CK: 733 U/L — ABNORMAL HIGH (ref 7–232)

## 2012-05-31 LAB — CBC
HCT: 41.3 % (ref 39.0–52.0)
MCV: 71.8 fL — ABNORMAL LOW (ref 78.0–100.0)
Platelets: 286 10*3/uL (ref 150–400)
RBC: 5.75 MIL/uL (ref 4.22–5.81)
WBC: 9.2 10*3/uL (ref 4.0–10.5)

## 2012-05-31 LAB — MAGNESIUM: Magnesium: 2 mg/dL (ref 1.5–2.5)

## 2012-05-31 LAB — CREATININE, SERUM
GFR calc Af Amer: >90 mL/min (ref >90)
GFR calc non Af Amer: >90 mL/min (ref >90)

## 2012-05-31 LAB — CBC WITH DIFFERENTIAL/PLATELET
Eosinophils Absolute: 0 10*3/uL (ref 0.0–0.7)
Hemoglobin: 15.3 g/dL (ref 13.0–17.0)
Lymphs Abs: 2.4 10*3/uL (ref 0.7–4.0)
MCH: 24.8 pg — ABNORMAL LOW (ref 26.0–34.0)
Monocytes Relative: 6 % (ref 3–12)
Neutro Abs: 5.4 10*3/uL (ref 1.7–7.7)
Neutrophils Relative %: 65 % (ref 43–77)
RBC: 6.18 MIL/uL — ABNORMAL HIGH (ref 4.22–5.81)

## 2012-05-31 MED ORDER — PANTOPRAZOLE SODIUM 40 MG IV SOLR
40.0000 mg | Freq: Once | INTRAVENOUS | Status: AC
  Administered 2012-05-31: 40 mg via INTRAVENOUS
  Filled 2012-05-31: qty 40

## 2012-05-31 MED ORDER — ONDANSETRON HCL 4 MG PO TABS: 4.0000 mg | ORAL_TABLET | Freq: Four times a day (QID) | ORAL | Status: DC | PRN

## 2012-05-31 MED ORDER — BISACODYL 5 MG PO TBEC: 10.0000 mg | DELAYED_RELEASE_TABLET | Freq: Every day | ORAL | Status: DC | PRN

## 2012-05-31 MED ORDER — ACETAMINOPHEN 650 MG RE SUPP
650.0000 mg | Freq: Four times a day (QID) | RECTAL | Status: DC | PRN
  Filled 2012-05-31: qty 1

## 2012-05-31 MED ORDER — PROMETHAZINE HCL 25 MG/ML IJ SOLN
12.5000 mg | INTRAMUSCULAR | Status: DC | PRN
  Administered 2012-05-31 (×2): 12.5 mg via INTRAVENOUS
  Filled 2012-05-31 (×3): qty 1

## 2012-05-31 MED ORDER — ENOXAPARIN SODIUM 40 MG/0.4ML ~~LOC~~ SOLN
40.0000 mg | SUBCUTANEOUS | Status: DC
  Administered 2012-05-31: 40 mg via SUBCUTANEOUS
  Filled 2012-05-31 (×2): qty 0.4

## 2012-05-31 MED ORDER — HYDROMORPHONE HCL PF 1 MG/ML IJ SOLN
1.0000 mg | INTRAMUSCULAR | Status: DC | PRN
  Administered 2012-05-31 – 2012-06-01 (×5): 1 mg via INTRAVENOUS
  Filled 2012-05-31 (×5): qty 1

## 2012-05-31 MED ORDER — POTASSIUM CHLORIDE 10 MEQ/100ML IV SOLN: 10.0000 meq | INTRAVENOUS | Status: DC

## 2012-05-31 MED ORDER — METOCLOPRAMIDE HCL 5 MG/ML IJ SOLN
10.0000 mg | Freq: Once | INTRAMUSCULAR | Status: AC
  Administered 2012-05-31: 10 mg via INTRAVENOUS
  Filled 2012-05-31: qty 2

## 2012-05-31 MED ORDER — ONDANSETRON HCL 4 MG/2ML IJ SOLN
4.0000 mg | Freq: Once | INTRAMUSCULAR | Status: AC
  Administered 2012-05-31: 4 mg via INTRAVENOUS
  Filled 2012-05-31: qty 2

## 2012-05-31 MED ORDER — PROMETHAZINE HCL 25 MG/ML IJ SOLN: 12.5000 mg | Freq: Once | INTRAMUSCULAR | Status: DC

## 2012-05-31 MED ORDER — ONDANSETRON HCL 4 MG/2ML IJ SOLN: 4.0000 mg | Freq: Four times a day (QID) | INTRAMUSCULAR | Status: DC | PRN

## 2012-05-31 MED ORDER — THIAMINE HCL 100 MG/ML IJ SOLN
Freq: Once | INTRAVENOUS | Status: AC
  Administered 2012-05-31: 12:00:00 via INTRAVENOUS
  Filled 2012-05-31: qty 1000

## 2012-05-31 MED ORDER — HYDROMORPHONE HCL PF 1 MG/ML IJ SOLN
1.0000 mg | Freq: Once | INTRAMUSCULAR | Status: AC
  Administered 2012-05-31: 1 mg via INTRAVENOUS
  Filled 2012-05-31: qty 1

## 2012-05-31 MED ORDER — POTASSIUM CHLORIDE 10 MEQ/100ML IV SOLN
10.0000 meq | Freq: Once | INTRAVENOUS | Status: AC
  Administered 2012-05-31: 10 meq via INTRAVENOUS
  Filled 2012-05-31: qty 100

## 2012-05-31 MED ORDER — METOCLOPRAMIDE HCL 5 MG/ML IJ SOLN
5.0000 mg | Freq: Three times a day (TID) | INTRAMUSCULAR | Status: DC
  Administered 2012-05-31 – 2012-06-01 (×4): 5 mg via INTRAVENOUS
  Filled 2012-05-31: qty 2
  Filled 2012-05-31 (×5): qty 1
  Filled 2012-05-31: qty 2

## 2012-05-31 MED ORDER — ALUM & MAG HYDROXIDE-SIMETH 200-200-20 MG/5ML PO SUSP
30.0000 mL | Freq: Four times a day (QID) | ORAL | Status: DC | PRN
  Filled 2012-05-31: qty 30

## 2012-05-31 MED ORDER — ONDANSETRON HCL 4 MG/2ML IJ SOLN
4.0000 mg | Freq: Four times a day (QID) | INTRAMUSCULAR | Status: DC | PRN
  Administered 2012-05-31 – 2012-06-01 (×3): 4 mg via INTRAVENOUS
  Filled 2012-05-31 (×3): qty 2

## 2012-05-31 MED ORDER — SODIUM CHLORIDE 0.9 % IV BOLUS (SEPSIS)
2000.0000 mL | Freq: Once | INTRAVENOUS | Status: AC
  Administered 2012-05-31: 2000 mL via INTRAVENOUS

## 2012-05-31 MED ORDER — PANTOPRAZOLE SODIUM 40 MG IV SOLR
40.0000 mg | Freq: Two times a day (BID) | INTRAVENOUS | Status: DC
  Administered 2012-05-31 – 2012-06-01 (×2): 40 mg via INTRAVENOUS
  Filled 2012-05-31 (×3): qty 40

## 2012-05-31 MED ORDER — POTASSIUM CHLORIDE 10 MEQ/100ML IV SOLN
10.0000 meq | INTRAVENOUS | Status: AC
  Administered 2012-05-31 (×3): 10 meq via INTRAVENOUS
  Filled 2012-05-31 (×3): qty 100

## 2012-05-31 MED ORDER — ACETAMINOPHEN 325 MG PO TABS: 650.0000 mg | ORAL_TABLET | Freq: Four times a day (QID) | ORAL | Status: DC | PRN

## 2012-05-31 MED ORDER — SODIUM CHLORIDE 0.9 % IV SOLN
INTRAVENOUS | Status: DC
  Administered 2012-05-31: 18:00:00 via INTRAVENOUS

## 2012-05-31 MED ORDER — OXYCODONE HCL 5 MG PO TABS: 5.0000 mg | ORAL_TABLET | ORAL | Status: DC | PRN
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
## 2012-05-31 NOTE — ED Notes (Signed)
Pt states he was here all day Sunday into Monday morning for nausea/vomiting, states he was sent home w/ prescription for nausea but cannot afford, states "I called pharmacy and they said it would cost 100 dollars". Pt denies diarrhea since Sunday, states still having the nausea and vomiting, pt actively vomiting, states he's unable to keep any food or fluids down. Pt states having mid abdominal pain.

## 2012-05-31 NOTE — ED Notes (Signed)
Pt seen here Sunday for nausea, unable to afford Rx after discharge, has continued to vomit, unable to keep anything down.

## 2012-05-31 NOTE — Progress Notes (Signed)
Disposition Note  [name], is a 46 y.o. male,   MRN: [id number]  -  DOB - 1966-02-16  Outpatient Primary MD for the patient is Provider Not In System Patient has no PCP   Blood pressure 179/66, pulse 50, temperature 98.3 F (36.8 C), temperature source Oral, resp. rate 18, SpO2 100.00%.  Active Problems:  Nausea and vomiting in adult  Hypokalemia  PUD (peptic ulcer disease)  Hypertension  Dehydration  Alcohol abuse  GERD (gastroesophageal reflux disease)   46 yo male with history of alcohol abuse and gastric ulcer began vomiting Saturday 8/10.  He was treated and released from the ED on 8/11.  He stated to the EDP that he never felt any better.  Returns today with continued vomiting.  Continues to vomit in the ED.  Reports his pain feels like his previous ulcer.  Will give IV potassium to replete K of 3.2.  Will also give IV protonix.  Order a med/surg bed.  Observation admission.  I did not examine this patient.   Algis Downs, PA-C [facility] Pager: [phone]
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
## 2012-06-01 LAB — CARDIAC PANEL(CRET KIN+CKTOT+MB+TROPI)
CK, MB: 5.4 ng/mL — ABNORMAL HIGH (ref 0.3–4.0)
Relative Index: 0.6 (ref 0.0–2.5)
Relative Index: 0.5 (ref 0.0–2.5)
Troponin I: <0.3 ng/mL (ref <0.30)

## 2012-06-01 MED ORDER — PROMETHAZINE HCL 12.5 MG PO TABS: 12.5000 mg | ORAL_TABLET | Freq: Four times a day (QID) | ORAL | Status: DC | PRN

## 2012-06-01 MED ORDER — METOCLOPRAMIDE HCL 5 MG PO TABS: 5.0000 mg | ORAL_TABLET | Freq: Four times a day (QID) | ORAL | Status: DC

## 2012-06-01 MED ORDER — OMEPRAZOLE 20 MG PO CPDR: 20.0000 mg | DELAYED_RELEASE_CAPSULE | Freq: Two times a day (BID) | ORAL | Status: DC

## 2012-06-01 MED ORDER — OXYCODONE HCL 5 MG PO CAPS: 5.0000 mg | ORAL_CAPSULE | Freq: Four times a day (QID) | ORAL | Status: DC | PRN

## 2012-06-01 MED ORDER — METOCLOPRAMIDE HCL 5 MG/ML IJ SOLN: 5.0000 mg | Freq: Three times a day (TID) | INTRAMUSCULAR | Status: DC

## 2012-06-01 NOTE — Discharge Summary (Signed)
Physician Discharge Summary  [name]:[id number] DOB: [birth year] DOA: 05/31/2012  PCP: Provider Not In System  Admit date: 05/31/2012 Discharge date: 06/01/2012  Recommendations for Outpatient Follow-up:  Follow-up Information    Please follow up. (PCP, [facility] in 1-2weeks, call for appt upon discharge)         Discharge Diagnoses:  Active Problems:  Hypokalemia  Nausea and vomiting in adult  Dehydration  PUD (peptic ulcer disease)  GERD (gastroesophageal reflux disease)  Hypertension   Discharge Condition: Improved/stable  Diet recommendation: Bland diet  There were no vitals filed for this visit.  History of present illness:  The patient is a 46 year old black male with past medical history significant for peptic ulcer disease diagnosed 4-5 years ago, GERD (long-standing history)  Presents with above complaints. He states that he's had problems with abdominal pain intermittently since he was a teenager, but states that about 4-5 years ago has already mentioned he was diagnosed with peptic ulcer disease. He states that 3 days ago he began having abdominal pain with nausea vomiting and the next day he came to the ED and was discharged with a prescription for Phenergan. She states that due to the cost he was unable to feel the prescription. His symptoms were persistent and so he came back to the ED today. He is reports that he usually takes Zantac but the last time remembers taking this was last week. He states that he has not had any alcohol to drink for the past 2 months. He is a smoker(tobacco) and admits to marijuana use. He describes that the pain as severe upper abdomen in location associated with nausea or vomiting as above-states prior to his first ED visit about 2 days ago he had noted some streaks of blood in his vomitus but none since then. He denies melena. No weight loss reported. In the ED a lipase was done and came back within normal limits LFTs also within  normal limits. He is admitted for further evaluation and management. Patient stated he takes his Zantac most of the time it forgets sometimes and also that he doesn't take carafate because of the cost.   Hospital Course by problem list:  GERD (gastroesophageal reflux /h/oPUD (peptic ulcer disease) disease)  -He is not anemic, no weight loss reported and no melena or hematochezia  -He is noncompliant with his medications citing cost for some, also was noted that the Phenergan PR that was prescribed in the ER was not filled dueto the cost  -On admission patient was placed on a PPI and Reglan as well as analgesics for pain management. On followup this a.m. his symptoms were much clinically improved and he was tolerating clears well and this were advanced to solids which he has tolerated well. He'll be discharged on a PPI, Reglan oral Phenergan when necessary. CM saw patient and it is noted that all his medications are on the $4 [employer] list which patient states he can afford.   Hypokalemia  -His potassium was replaced in the hospital. Nausea and vomiting in adult -resolved -Likely secondary to #1, lipase within normal limits and urinalysis was done on 8/11 unremarkable  Volume depletion  -Resolved with hydration Hypertension  -Pain was likely contributing on admission,Blood pressures today controlledon no med.No antihypertensives on his home med list, his to followup outpatient with PCP for further monitoring of his blood pressures and treatment as appropriate.   Procedures:  none  Consultations:  none  Discharge Exam: Filed Vitals:  06/01/12 1418  BP: 115/62  Pulse: 51  Temp: 98.3 F (36.8 C)  Resp: 18   Filed Vitals:   05/31/12 2100 06/01/12 0500 06/01/12 0515 06/01/12 1418  BP: 179/84 123/73  115/62  Pulse: 55 47 62 51  Temp: 99.5 F (37.5 C) 98.5 F (36.9 C)  98.3 F (36.8 C)  TempSrc: Oral Oral  Oral  Resp: 16 18  18   SpO2: 98% 100%  94%   exam Eyes: PERRL, EOMI,  conjunctivae normal, No scleral icterus.  Neck: Supple, Trachea midline normal ROM, No JVD, mass, thyromegaly, or carotid bruit present.  Cardiovascular: RRR, S1 normal, S2 normal, no MRG, pulses symmetric and intact bilaterally  Pulmonary/Chest: CTAB, no wheezes, rales, or rhonchi  Abdominal: Soft. Decreased Epigastric/upper abdominal tenderness, non-distended, bowel sounds are normal, no masses, organomegaly, no rebound tenderness or guarding present.  GU: no CVA tenderness Musculoskeletal: No joint deformities, erythema, or stiffness, ROM full and no nontender  Extremities: No cyanosis and no edema     Discharge Instructions  Discharge Orders    Future Orders Please Complete By Expires   Diet bland      Increase activity slowly        Medication List  As of 06/01/2012  6:17 PM   STOP taking these medications         promethazine 25 MG suppository         TAKE these medications         metoCLOPramide 5 MG tablet   Commonly known as: REGLAN   Take 1 tablet (5 mg total) by mouth 4 (four) times daily.      omeprazole 20 MG capsule   Commonly known as: PRILOSEC   Take 1 capsule (20 mg total) by mouth 2 (two) times daily before a meal.      oxycodone 5 MG capsule   Commonly known as: OXY-IR   Take 1 capsule (5 mg total) by mouth every 6 (six) hours as needed. pain      promethazine 12.5 MG tablet   Commonly known as: PHENERGAN   Take 1 tablet (12.5 mg total) by mouth every 6 (six) hours as needed for nausea.      ranitidine 75 MG tablet   Commonly known as: ZANTAC   Take 1 tablet (75 mg total) by mouth 2 (two) times daily as needed. For acid reflux           Follow-up Information    Please follow up. (PCP, [facility] in 1-2weeks, call for appt upon discharge)           The results of significant diagnostics from this hospitalization (including imaging, microbiology, ancillary and laboratory) are listed below for reference.    Significant Diagnostic  Studies: No results found.  Microbiology: No results found for this or any previous visit (from the past 240 hour(s)).   Labs: Basic Metabolic Panel:  Lab 05/31/12 1610 05/31/12 1130 05/31/12 0845 05/29/12 1913  NA -- -- 138 143  K -- -- 3.2* 3.4*  CL -- -- 101 103  CO2 -- -- 24 30  GLUCOSE -- -- 114* 140*  BUN -- -- 21 8  CREATININE 0.62 -- 0.81 0.67  CALCIUM -- -- 10.0 10.0  MG -- 2.0 -- --  PHOS -- -- -- --   Liver Function Tests:  Lab 05/31/12 1130 05/31/12 0845 05/29/12 1913  AST 19 19 28   ALT 17 16 19   ALKPHOS 64 65 71  BILITOT 0.5 0.5 0.4  PROT 8.0 8.0 8.2  ALBUMIN 4.5 4.5 4.6    Lab 05/31/12 0845 05/29/12 1913  LIPASE 16 14  AMYLASE -- --   No results found for this basename: AMMONIA:5 in the last 168 hours CBC:  Lab 05/31/12 1850 05/31/12 0845 05/29/12 1913  WBC 9.2 8.3 11.0*  NEUTROABS -- 5.4 9.6*  HGB 14.1 15.3 13.9  HCT 41.3 44.7 42.3  MCV 71.8* 72.3* 73.1*  PLT 286 323 340   Cardiac Enzymes:  Lab 06/01/12 0912 06/01/12 0104 05/31/12 1850 05/31/12 0845  CKTOTAL 1048* 921* 733* --  CKMB 5.4* 5.1* 4.7* --  CKMBINDEX -- -- -- --  TROPONINI <0.30 <0.30 <0.30 <0.30   BNP: BNP (last 3 results) No results found for this basename: PROBNP:3 in the last 8760 hours CBG: No results found for this basename: GLUCAP:5 in the last 168 hours  Time coordinating discharge:<30 minutes  Signed:  Kela Millin  [facility] 06/01/2012, 6:17 PM

## 2012-06-01 NOTE — Progress Notes (Signed)
INITIAL ADULT NUTRITION ASSESSMENT Date: 06/01/2012   Time: 12:20 PM Reason for Assessment: Nutrition risk   INTERVENTION: Nutrition therapy for nausea/vomiting discussed. Will monitor.   ASSESSMENT: Male 46 y.o.  Dx: Persistent nausea, vomiting, and abdominal pain  Food/Nutrition Related Hx: Pt reports typically eating well with great appetite, no changes in weight. Pt reports on Saturday night his wife made him a chicken salad which included onions and jalapeno peppers (neither which ingredient pt knew was in the chicken salad) to which pt added additional jalapeno peppers. Pt stated he also ate 2 twinkies and a glass of milk. Pt states he had diarrhea and vomiting the next day that was so bad he couldn't keep anything down. Pt discussed his ED visit and his inability to pay for his medications. Pt stated he feels better today and has not had any vomiting, some nausea. Pt states he has had stomach problems since he was a teenager. Pt states he has been tolerating the clear liquid diet. Pt states last emesis was yesterday but it was mostly spit up.   Hx:  Past Medical History  Diagnosis Date  . GERD (gastroesophageal reflux disease)   . Gastric ulcer   . Alcohol abuse    Related Meds:  Scheduled Meds:   . enoxaparin (LOVENOX) injection  40 mg Subcutaneous Q24H  . metoCLOPramide (REGLAN) injection  5 mg Intravenous TID AC & HS  . pantoprazole (PROTONIX) IV  40 mg Intravenous Q12H  . potassium chloride  10 mEq Intravenous Q1 Hr x 3  . potassium chloride  10 mEq Intravenous Once  . DISCONTD: potassium chloride  10 mEq Intravenous Q1 Hr x 4   Continuous Infusions:   . sodium chloride 100 mL/hr at 05/31/12 1757   PRN Meds:.bisacodyl, HYDROmorphone (DILAUDID) injection, ondansetron (ZOFRAN) IV, ondansetron, DISCONTD: acetaminophen, DISCONTD: acetaminophen, DISCONTD: alum & mag hydroxide-simeth, DISCONTD: ondansetron (ZOFRAN) IV, DISCONTD: ondansetron, DISCONTD: oxyCODONE, DISCONTD:  promethazine  Ht:  6' (182.8 cm)  Wt:  223 lb (101.1kg) from April 2013  Ideal Wt:    178 lb % Ideal Wt: 125  Usual Wt: 225-230 lb % Usual Wt: 97-99  Body mass index is 31.2 kg/(m^2) based on weight from April 2013 admission. Class I obesity.   Labs:  CMP     Component Value Date/Time   NA 138 05/31/2012 0845   K 3.2* 05/31/2012 0845   CL 101 05/31/2012 0845   CO2 24 05/31/2012 0845   GLUCOSE 114* 05/31/2012 0845   BUN 21 05/31/2012 0845   CREATININE 0.62 05/31/2012 1850   CALCIUM 10.0 05/31/2012 0845   PROT 8.0 05/31/2012 1130   ALBUMIN 4.5 05/31/2012 1130   AST 19 05/31/2012 1130   ALT 17 05/31/2012 1130   ALKPHOS 64 05/31/2012 1130   BILITOT 0.5 05/31/2012 1130   GFRNONAA >90 05/31/2012 1850   GFRAA >90 05/31/2012 1850    Intake/Output Summary (Last 24 hours) at 06/01/12 1225 Last data filed at 06/01/12 0940  Gross per 24 hour  Intake      0 ml  Output    875 ml  Net   -875 ml   Last BM - 05/29/12  Diet Order: Parke Simmers   IVF:    sodium chloride Last Rate: 100 mL/hr at 05/31/12 1757    Estimated Nutritional Needs:   Kcal: 2000-2200 Protein: 80-95g  Fluid: 2-2.2L  NUTRITION DIAGNOSIS: -Inadequate oral intake (NI-2.1).  Status: Ongoing  RELATED TO: nausea/vomiting/abdominal pain  AS EVIDENCE BY: pt statement  MONITORING/EVALUATION(Goals):  1.  Resolution of nausea/vomiting 2. Pt to consume >90% of meals.   EDUCATION NEEDS: -Education needs addressed - discussed nutrition therapy for nausea/vomiting.   Dietitian #: [phone]  DOCUMENTATION CODES Per approved criteria  -Obesity Unspecified    Marshall Cork 06/01/2012, 12:20 PM

## 2012-06-13 ENCOUNTER — Emergency Department (HOSPITAL_COMMUNITY)
Admission: EM | Admit: 2012-06-13 | Discharge: 2012-06-13 | Disposition: A | Discharge status: Home / Self Care | Payer: Self-pay | Attending: Emergency Medicine | Admitting: Emergency Medicine

## 2012-06-13 ENCOUNTER — Encounter (HOSPITAL_COMMUNITY): Payer: Self-pay | Admitting: *Deleted

## 2012-06-13 DIAGNOSIS — K2990 Gastroduodenitis, unspecified, without bleeding: Secondary | ICD-10-CM | POA: Insufficient documentation

## 2012-06-13 DIAGNOSIS — Z9089 Acquired absence of other organs: Secondary | ICD-10-CM | POA: Insufficient documentation

## 2012-06-13 DIAGNOSIS — K2970 Gastritis, unspecified, without bleeding: Secondary | ICD-10-CM

## 2012-06-13 DIAGNOSIS — R109 Unspecified abdominal pain: Secondary | ICD-10-CM

## 2012-06-13 DIAGNOSIS — R112 Nausea with vomiting, unspecified: Secondary | ICD-10-CM | POA: Insufficient documentation

## 2012-06-13 LAB — CBC WITH DIFFERENTIAL/PLATELET
Eosinophils Absolute: 0 10*3/uL (ref 0.0–0.7)
Hemoglobin: 15.7 g/dL (ref 13.0–17.0)
Lymphocytes Relative: 16 % (ref 12–46)
Lymphs Abs: 2.1 10*3/uL (ref 0.7–4.0)
MCH: 24.7 pg — ABNORMAL LOW (ref 26.0–34.0)
Monocytes Relative: 10 % (ref 3–12)
Neutro Abs: 9.7 10*3/uL — ABNORMAL HIGH (ref 1.7–7.7)
Neutrophils Relative %: 74 % (ref 43–77)
Platelets: 346 10*3/uL (ref 150–400)
RBC: 6.36 MIL/uL — ABNORMAL HIGH (ref 4.22–5.81)
WBC: 13 10*3/uL — ABNORMAL HIGH (ref 4.0–10.5)

## 2012-06-13 LAB — BASIC METABOLIC PANEL
BUN: 16 mg/dL (ref 6–23)
CO2: 27 mEq/L (ref 19–32)
Chloride: 97 mEq/L (ref 96–112)
GFR calc non Af Amer: >90 mL/min (ref >90)
Glucose, Bld: 130 mg/dL — ABNORMAL HIGH (ref 70–99)
Potassium: 3.7 mEq/L (ref 3.5–5.1)
Sodium: 140 mEq/L (ref 135–145)

## 2012-06-13 LAB — URINALYSIS, ROUTINE W REFLEX MICROSCOPIC
Glucose, UA: NEGATIVE mg/dL
Protein, ur: 100 mg/dL — AB
Specific Gravity, Urine: 1.028 (ref 1.005–1.030)

## 2012-06-13 LAB — URINE MICROSCOPIC-ADD ON

## 2012-06-13 LAB — POCT I-STAT TROPONIN I: Troponin i, poc: 0.02 ng/mL (ref 0.00–0.08)

## 2012-06-13 MED ORDER — MORPHINE SULFATE 4 MG/ML IJ SOLN
4.0000 mg | Freq: Once | INTRAMUSCULAR | Status: AC
  Administered 2012-06-13: 4 mg via INTRAVENOUS
  Filled 2012-06-13: qty 1

## 2012-06-13 MED ORDER — OXYCODONE-ACETAMINOPHEN 5-325 MG PO TABS
1.0000 | ORAL_TABLET | Freq: Once | ORAL | Status: AC
  Administered 2012-06-13: 1 via ORAL
  Filled 2012-06-13: qty 1

## 2012-06-13 MED ORDER — PANTOPRAZOLE SODIUM 40 MG IV SOLR
40.0000 mg | INTRAVENOUS | Status: AC
  Administered 2012-06-13: 40 mg via INTRAVENOUS
  Filled 2012-06-13: qty 40

## 2012-06-13 MED ORDER — METOCLOPRAMIDE HCL 5 MG/ML IJ SOLN
10.0000 mg | INTRAMUSCULAR | Status: AC
  Administered 2012-06-13: 10 mg via INTRAVENOUS
  Filled 2012-06-13: qty 2

## 2012-06-13 MED ORDER — ONDANSETRON 4 MG PO TBDP
4.0000 mg | ORAL_TABLET | Freq: Once | ORAL | Status: AC
  Administered 2012-06-13: 4 mg via ORAL
  Filled 2012-06-13: qty 1

## 2012-06-13 MED ORDER — ONDANSETRON 8 MG PO TBDP
ORAL_TABLET | ORAL | Status: AC
  Administered 2012-06-13: 8 mg via ORAL
  Filled 2012-06-13: qty 1

## 2012-06-13 MED ORDER — PROMETHAZINE HCL 25 MG PO TABS: 25.0000 mg | ORAL_TABLET | Freq: Four times a day (QID) | ORAL | Status: DC | PRN

## 2012-06-13 MED ORDER — GI COCKTAIL ~~LOC~~
30.0000 mL | Freq: Once | ORAL | Status: AC
  Administered 2012-06-13: 30 mL via ORAL
  Filled 2012-06-13: qty 30

## 2012-06-13 MED ORDER — SODIUM CHLORIDE 0.9 % IV BOLUS (SEPSIS)
1000.0000 mL | INTRAVENOUS | Status: AC
  Administered 2012-06-13: 1000 mL via INTRAVENOUS

## 2012-06-13 MED ORDER — PANTOPRAZOLE SODIUM 20 MG PO TBEC: 20.0000 mg | DELAYED_RELEASE_TABLET | Freq: Two times a day (BID) | ORAL | Status: DC

## 2012-06-13 MED ORDER — OXYCODONE-ACETAMINOPHEN 5-325 MG PO TABS: 1.0000 | ORAL_TABLET | Freq: Four times a day (QID) | ORAL | Status: AC | PRN

## 2012-06-13 MED ORDER — ONDANSETRON 8 MG PO TBDP
8.0000 mg | ORAL_TABLET | Freq: Once | ORAL | Status: AC
  Administered 2012-06-13: 8 mg via ORAL

## 2012-06-13 NOTE — ED Notes (Signed)
Pt aware urine sample needed, unable to void at this time

## 2012-06-13 NOTE — ED Notes (Signed)
Discharge instructions reviewed w/ pt., verbalizes understanding. Three prescriptions provided at discharge. 

## 2012-06-13 NOTE — ED Notes (Signed)
Pt reports being in hospital a few weeks ago for abd pain. Sent home with Rx, sts could not afford all, got nausea meds. Ran out Saturday, was taking otc zantac but cannot keep that down now. C/o epigastric pain, 7/10, n/v.
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
## 2012-10-16 ENCOUNTER — Emergency Department (HOSPITAL_COMMUNITY)
Admission: EM | Admit: 2012-10-16 | Discharge: 2012-10-16 | Disposition: A | Discharge status: Home / Self Care | Payer: Medicaid Other | Attending: Emergency Medicine | Admitting: Emergency Medicine

## 2012-10-16 ENCOUNTER — Encounter (HOSPITAL_COMMUNITY): Payer: Self-pay | Admitting: Emergency Medicine

## 2012-10-16 DIAGNOSIS — F1021 Alcohol dependence, in remission: Secondary | ICD-10-CM | POA: Insufficient documentation

## 2012-10-16 DIAGNOSIS — K089 Disorder of teeth and supporting structures, unspecified: Secondary | ICD-10-CM | POA: Insufficient documentation

## 2012-10-16 DIAGNOSIS — K0889 Other specified disorders of teeth and supporting structures: Secondary | ICD-10-CM

## 2012-10-16 DIAGNOSIS — F17200 Nicotine dependence, unspecified, uncomplicated: Secondary | ICD-10-CM | POA: Insufficient documentation

## 2012-10-16 DIAGNOSIS — Z8719 Personal history of other diseases of the digestive system: Secondary | ICD-10-CM | POA: Insufficient documentation

## 2012-10-16 MED ORDER — TRAMADOL HCL 50 MG PO TABS
50.0000 mg | ORAL_TABLET | Freq: Once | ORAL | Status: AC
  Administered 2012-10-16: 50 mg via ORAL
  Filled 2012-10-16: qty 1

## 2012-10-16 MED ORDER — PENICILLIN V POTASSIUM 500 MG PO TABS
500.0000 mg | ORAL_TABLET | Freq: Once | ORAL | Status: AC
Start: 2012-10-16 — End: 2012-10-16
  Administered 2012-10-16: 500 mg via ORAL
  Filled 2012-10-16: qty 1

## 2012-10-16 MED ORDER — PENICILLIN V POTASSIUM 500 MG PO TABS: 500.0000 mg | ORAL_TABLET | Freq: Three times a day (TID) | ORAL | Status: DC

## 2012-10-16 MED ORDER — TRAMADOL HCL 50 MG PO TABS: 50.0000 mg | ORAL_TABLET | Freq: Four times a day (QID) | ORAL | Status: DC | PRN

## 2012-10-16 NOTE — ED Provider Notes (Signed)
Medical screening examination/treatment/procedure(s) were performed by non-physician practitioner and as supervising physician I was immediately available for consultation/collaboration.  Martha K Linker, MD 10/16/12 1611 

## 2012-10-16 NOTE — ED Notes (Signed)
Patient states that he has pain to his left lower jaw today. There is a whole in his tooth
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
## 2013-06-05 ENCOUNTER — Encounter (HOSPITAL_COMMUNITY): Payer: Self-pay | Admitting: Emergency Medicine

## 2013-06-05 ENCOUNTER — Observation Stay (HOSPITAL_COMMUNITY)
Admission: EM | Admit: 2013-06-05 | Discharge: 2013-06-07 | Disposition: A | Discharge status: Home / Self Care | Payer: Medicaid Other | Attending: Internal Medicine | Admitting: Internal Medicine

## 2013-06-05 ENCOUNTER — Emergency Department (HOSPITAL_COMMUNITY): Payer: Medicaid Other

## 2013-06-05 DIAGNOSIS — E860 Dehydration: Secondary | ICD-10-CM | POA: Diagnosis present

## 2013-06-05 DIAGNOSIS — R112 Nausea with vomiting, unspecified: Principal | ICD-10-CM | POA: Diagnosis present

## 2013-06-05 DIAGNOSIS — R1115 Cyclical vomiting syndrome unrelated to migraine: Secondary | ICD-10-CM

## 2013-06-05 DIAGNOSIS — F1010 Alcohol abuse, uncomplicated: Secondary | ICD-10-CM

## 2013-06-05 DIAGNOSIS — Z8711 Personal history of peptic ulcer disease: Secondary | ICD-10-CM | POA: Insufficient documentation

## 2013-06-05 DIAGNOSIS — E876 Hypokalemia: Secondary | ICD-10-CM

## 2013-06-05 DIAGNOSIS — I10 Essential (primary) hypertension: Secondary | ICD-10-CM

## 2013-06-05 DIAGNOSIS — K219 Gastro-esophageal reflux disease without esophagitis: Secondary | ICD-10-CM | POA: Diagnosis present

## 2013-06-05 DIAGNOSIS — K279 Peptic ulcer, site unspecified, unspecified as acute or chronic, without hemorrhage or perforation: Secondary | ICD-10-CM

## 2013-06-05 LAB — URINALYSIS W MICROSCOPIC + REFLEX CULTURE
Glucose, UA: NEGATIVE mg/dL
Leukocytes, UA: NEGATIVE
Specific Gravity, Urine: 1.029 (ref 1.005–1.030)
pH: 6 (ref 5.0–8.0)

## 2013-06-05 LAB — COMPREHENSIVE METABOLIC PANEL
ALT: 19 U/L (ref 0–53)
AST: 21 U/L (ref 0–37)
Albumin: 4 g/dL (ref 3.5–5.2)
CO2: 22 mEq/L (ref 19–32)
Calcium: 10.1 mg/dL (ref 8.4–10.5)
Creatinine, Ser: 0.73 mg/dL (ref 0.50–1.35)
Sodium: 138 mEq/L (ref 135–145)
Total Protein: 8.2 g/dL (ref 6.0–8.3)

## 2013-06-05 LAB — CBC WITH DIFFERENTIAL/PLATELET
Basophils Absolute: 0 10*3/uL (ref 0.0–0.1)
Eosinophils Relative: 0 % (ref 0–5)
MCH: 24 pg — ABNORMAL LOW (ref 26.0–34.0)
Monocytes Absolute: 0.5 10*3/uL (ref 0.1–1.0)
Neutrophils Relative %: 80 % — ABNORMAL HIGH (ref 43–77)
Platelets: 361 10*3/uL (ref 150–400)
RBC: 5.79 MIL/uL (ref 4.22–5.81)
RDW: 15 % (ref 11.5–15.5)
WBC: 13 10*3/uL — ABNORMAL HIGH (ref 4.0–10.5)

## 2013-06-05 LAB — POCT I-STAT TROPONIN I

## 2013-06-05 LAB — RAPID URINE DRUG SCREEN, HOSP PERFORMED
Barbiturates: NOT DETECTED
Benzodiazepines: NOT DETECTED
Cocaine: NOT DETECTED

## 2013-06-05 MED ORDER — POTASSIUM CHLORIDE IN NACL 40-0.9 MEQ/L-% IV SOLN
INTRAVENOUS | Status: DC
  Administered 2013-06-05 – 2013-06-06 (×3): via INTRAVENOUS
  Filled 2013-06-05 (×3): qty 1000

## 2013-06-05 MED ORDER — FAMOTIDINE IN NACL 20-0.9 MG/50ML-% IV SOLN
20.0000 mg | Freq: Two times a day (BID) | INTRAVENOUS | Status: DC
  Administered 2013-06-05 – 2013-06-07 (×4): 20 mg via INTRAVENOUS
  Filled 2013-06-05 (×4): qty 50

## 2013-06-05 MED ORDER — GI COCKTAIL ~~LOC~~
30.0000 mL | Freq: Once | ORAL | Status: AC
  Administered 2013-06-05: 30 mL via ORAL
  Filled 2013-06-05: qty 30

## 2013-06-05 MED ORDER — PROMETHAZINE HCL 25 MG/ML IJ SOLN
12.5000 mg | Freq: Four times a day (QID) | INTRAMUSCULAR | Status: DC | PRN
  Administered 2013-06-05: 25 mg via INTRAVENOUS
  Administered 2013-06-06: 12.5 mg via INTRAVENOUS
  Administered 2013-06-06 – 2013-06-07 (×2): 25 mg via INTRAVENOUS
  Filled 2013-06-05 (×5): qty 1

## 2013-06-05 MED ORDER — METOCLOPRAMIDE HCL 5 MG/ML IJ SOLN
10.0000 mg | Freq: Three times a day (TID) | INTRAMUSCULAR | Status: DC
  Administered 2013-06-05 – 2013-06-07 (×5): 10 mg via INTRAVENOUS
  Filled 2013-06-05 (×11): qty 2

## 2013-06-05 MED ORDER — HYDROMORPHONE HCL PF 1 MG/ML IJ SOLN
1.0000 mg | Freq: Once | INTRAMUSCULAR | Status: AC
  Administered 2013-06-05: 1 mg via INTRAVENOUS
  Filled 2013-06-05: qty 1

## 2013-06-05 MED ORDER — MORPHINE SULFATE 2 MG/ML IJ SOLN
2.0000 mg | INTRAMUSCULAR | Status: DC | PRN
  Administered 2013-06-05 – 2013-06-07 (×10): 2 mg via INTRAVENOUS
  Filled 2013-06-05 (×10): qty 1

## 2013-06-05 MED ORDER — PROMETHAZINE HCL 25 MG/ML IJ SOLN
12.5000 mg | Freq: Four times a day (QID) | INTRAMUSCULAR | Status: DC | PRN
  Filled 2013-06-05: qty 1

## 2013-06-05 MED ORDER — ONDANSETRON HCL 4 MG/2ML IJ SOLN
4.0000 mg | Freq: Four times a day (QID) | INTRAMUSCULAR | Status: AC
  Administered 2013-06-05 (×2): 4 mg via INTRAVENOUS
  Filled 2013-06-05: qty 2

## 2013-06-05 MED ORDER — SODIUM CHLORIDE 0.9 % IV BOLUS (SEPSIS)
1000.0000 mL | Freq: Once | INTRAVENOUS | Status: AC
  Administered 2013-06-05: 1000 mL via INTRAVENOUS

## 2013-06-05 MED ORDER — PROMETHAZINE HCL 25 MG/ML IJ SOLN
12.5000 mg | Freq: Once | INTRAMUSCULAR | Status: AC
Start: 2013-06-05 — End: 2013-06-05
  Administered 2013-06-05: 12.5 mg via INTRAVENOUS
  Filled 2013-06-05: qty 1

## 2013-06-05 MED ORDER — MORPHINE SULFATE 4 MG/ML IJ SOLN
8.0000 mg | Freq: Once | INTRAMUSCULAR | Status: AC
  Administered 2013-06-05: 8 mg via INTRAVENOUS
  Filled 2013-06-05: qty 2

## 2013-06-05 MED ORDER — PROMETHAZINE HCL 25 MG/ML IJ SOLN
12.5000 mg | Freq: Once | INTRAMUSCULAR | Status: AC
  Administered 2013-06-05: 12.5 mg via INTRAVENOUS
  Filled 2013-06-05: qty 1

## 2013-06-05 MED ORDER — ONDANSETRON 8 MG PO TBDP: 8.0000 mg | ORAL_TABLET | Freq: Once | ORAL | Status: DC

## 2013-06-05 MED ORDER — PANTOPRAZOLE SODIUM 40 MG IV SOLR: 40.0000 mg | Freq: Once | INTRAVENOUS | Status: DC

## 2013-06-05 MED ORDER — PANTOPRAZOLE SODIUM 40 MG IV SOLR
40.0000 mg | Freq: Two times a day (BID) | INTRAVENOUS | Status: DC
Start: 2013-06-05 — End: 2013-06-07
  Administered 2013-06-05 – 2013-06-07 (×4): 40 mg via INTRAVENOUS
  Filled 2013-06-05 (×5): qty 40

## 2013-06-05 MED ORDER — FAMOTIDINE IN NACL 20-0.9 MG/50ML-% IV SOLN
20.0000 mg | Freq: Once | INTRAVENOUS | Status: AC
  Administered 2013-06-05: 20 mg via INTRAVENOUS
  Filled 2013-06-05: qty 50

## 2013-06-05 MED ORDER — METOCLOPRAMIDE HCL 5 MG/ML IJ SOLN
10.0000 mg | Freq: Once | INTRAMUSCULAR | Status: AC
  Administered 2013-06-05: 10 mg via INTRAVENOUS
  Filled 2013-06-05: qty 2

## 2013-06-05 MED ORDER — ONDANSETRON HCL 4 MG PO TABS: 4.0000 mg | ORAL_TABLET | Freq: Four times a day (QID) | ORAL | Status: DC | PRN

## 2013-06-05 MED ORDER — SODIUM CHLORIDE 0.9 % IV SOLN: INTRAVENOUS | Status: DC

## 2013-06-05 MED ORDER — ONDANSETRON HCL 4 MG/2ML IJ SOLN
INTRAMUSCULAR | Status: AC
  Administered 2013-06-05: 4 mg via INTRAVENOUS
  Filled 2013-06-05: qty 2

## 2013-06-05 MED ORDER — PANTOPRAZOLE SODIUM 40 MG IV SOLR
40.0000 mg | Freq: Once | INTRAVENOUS | Status: AC
  Administered 2013-06-05: 40 mg via INTRAVENOUS
  Filled 2013-06-05: qty 40

## 2013-06-05 MED ORDER — POLYETHYLENE GLYCOL 3350 17 G PO PACK
17.0000 g | PACK | Freq: Every day | ORAL | Status: DC | PRN
  Filled 2013-06-05: qty 1

## 2013-06-05 MED ORDER — ONDANSETRON HCL 4 MG/2ML IJ SOLN
4.0000 mg | Freq: Four times a day (QID) | INTRAMUSCULAR | Status: DC | PRN
  Administered 2013-06-05 – 2013-06-07 (×4): 4 mg via INTRAVENOUS
  Filled 2013-06-05 (×4): qty 2

## 2013-06-05 MED ORDER — LORAZEPAM 2 MG/ML IJ SOLN
1.0000 mg | Freq: Once | INTRAMUSCULAR | Status: AC
  Administered 2013-06-05: 1 mg via INTRAVENOUS
  Filled 2013-06-05: qty 1
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
## 2013-06-05 NOTE — ED Notes (Signed)
Pt unable to provide urine at this time

## 2013-06-05 NOTE — Progress Notes (Signed)
Pt does not feel like signing up for My Chart at current time. Briscoe Burns BSN, RN-BC Admissions RN  06/05/2013 4:20 PM
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
## 2013-06-05 NOTE — ED Notes (Signed)
Per EMS pt started vomiting this morning at 4 am. Has had nausea and vomiting every since. C/o of abdominal pain and dehydration. Pt reported not eating today.CBG 134. Pt says he has vomited 15-20 times since 0400.

## 2013-06-06 LAB — CBC
Hemoglobin: 13.3 g/dL (ref 13.0–17.0)
MCH: 23.9 pg — ABNORMAL LOW (ref 26.0–34.0)
MCV: 72.3 fL — ABNORMAL LOW (ref 78.0–100.0)
RBC: 5.56 MIL/uL (ref 4.22–5.81)

## 2013-06-06 LAB — TSH: TSH: 0.116 u[IU]/mL — ABNORMAL LOW (ref 0.350–4.500)

## 2013-06-06 LAB — COMPREHENSIVE METABOLIC PANEL
AST: 15 U/L (ref 0–37)
Albumin: 3.7 g/dL (ref 3.5–5.2)
Calcium: 9.8 mg/dL (ref 8.4–10.5)
Creatinine, Ser: 0.63 mg/dL (ref 0.50–1.35)
GFR calc non Af Amer: >90 mL/min (ref >90)

## 2013-06-06 LAB — PROTIME-INR: Prothrombin Time: 12.7 seconds (ref 11.6–15.2)

## 2013-06-06 MED ORDER — ADULT MULTIVITAMIN W/MINERALS CH
1.0000 | ORAL_TABLET | Freq: Every day | ORAL | Status: DC
  Administered 2013-06-07: 1 via ORAL
  Filled 2013-06-06: qty 1

## 2013-06-06 MED ORDER — BOOST / RESOURCE BREEZE PO LIQD: 1.0000 | Freq: Two times a day (BID) | ORAL | Status: DC

## 2013-06-06 MED ORDER — SODIUM CHLORIDE 0.9 % IV SOLN
INTRAVENOUS | Status: DC
  Administered 2013-06-06 – 2013-06-07 (×2): via INTRAVENOUS

## 2013-06-06 NOTE — Progress Notes (Signed)
[facility] PROGRESS NOTE Interim History: 47 y.o. male  Past medical history of GERD and peptic ulcer disease for 5 years prior to admission that comes in for intractable nausea and vomiting this started the day prior to admission progressively getting worse. He relates no black stools or red stools. He denies any NSAIDs or steroids use. He denies any hematemesis. He denies any abdominal pain except when he is retching that he feels a strain in the epigastric area   Assessment/Plan:  GERD (gastroesophageal reflux disease): - change IV fluids. - Clear liq diet. IV PPI.   Nausea and vomiting in adult - improved, due to #1.  Dehydration - resolved, d/c IV fluids if tolerates diet.    Code Status: full  Family Communication: none  Disposition Plan: home in am.    Consultants:  none  Procedures:  none  Antibiotics:  None  HPI/Subjective: No further vomiting. Will like to try clears.  Objective: Filed Vitals:   06/05/13 1619 06/05/13 1708 06/05/13 2104 06/06/13 0449  BP: 177/80 168/92 157/86 161/86  Pulse: 53 55 56 52  Temp: 99.9 F (37.7 C) 99.3 F (37.4 C) 99.6 F (37.6 C) 98.2 F (36.8 C)  TempSrc: Oral Oral Oral Oral  Resp: 20 18 18 20   Height:  5\' 11"  (1.803 m)    Weight:  88.2 kg (194 lb 7.1 oz)    SpO2: 100% 100% 100% 100%    Intake/Output Summary (Last 24 hours) at 06/06/13 1241 Last data filed at 06/06/13 1123  Gross per 24 hour  Intake 1583.33 ml  Output   2500 ml  Net -916.67 ml   Filed Weights   06/05/13 1708  Weight: 88.2 kg (194 lb 7.1 oz)    Exam:  General: Alert, awake, oriented x3, in no acute distress.  HEENT: No bruits, no goiter.  Heart: Regular rate and rhythm, without murmurs, rubs, gallops.  Lungs: Good air movement, clear to auscultation. Abdomen: Soft, mild suprapubic tenderness nondistended, positive bowel sounds.  Neuro: Grossly intact, nonfocal.   Data Reviewed: Basic Metabolic Panel:  Recent Labs Lab  06/05/13 1150 06/06/13 0500  NA 138 139  K 3.8 3.7  CL 104 102  CO2 22 24  GLUCOSE 142* 121*  BUN 13 10  CREATININE 0.73 0.63  CALCIUM 10.1 9.8   Liver Function Tests:  Recent Labs Lab 06/05/13 1150 06/06/13 0500  AST 21 15  ALT 19 16  ALKPHOS 76 68  BILITOT 0.3 0.4  PROT 8.2 7.8  ALBUMIN 4.0 3.7    Recent Labs Lab 06/05/13 1150  LIPASE 18   No results found for this basename: AMMONIA,  in the last 168 hours CBC:  Recent Labs Lab 06/05/13 1150 06/06/13 0500  WBC 13.0* 10.3  NEUTROABS 10.4*  --   HGB 13.9 13.3  HCT 42.0 40.2  MCV 72.5* 72.3*  PLT 361 302   Cardiac Enzymes: No results found for this basename: CKTOTAL, CKMB, CKMBINDEX, TROPONINI,  in the last 168 hours BNP (last 3 results) No results found for this basename: PROBNP,  in the last 8760 hours CBG: No results found for this basename: GLUCAP,  in the last 168 hours  No results found for this or any previous visit (from the past 240 hour(s)).   Studies: Dg Abd Acute W/chest  06/05/2013   *RADIOLOGY REPORT*  Clinical Data: Abdominal pain and vomiting.  ACUTE ABDOMEN SERIES (ABDOMEN 2 VIEW & CHEST 1 VIEW)  Comparison: 02/07/2012  Findings: The lungs are clear without  focal consolidation, edema, effusion or pneumothorax.  Cardiopericardial silhouette is within normal limits for size.  Imaged bony structures of the thorax are intact.  Upright film shows no evidence for intraperitoneal free air. There is no evidence for gaseous bowel dilation to suggest obstruction. Visualized bony structures are unremarkable.  IMPRESSION: Stable.  No acute cardiopulmonary findings.  Normal bowel gas pattern.   Original Report Authenticated By: Kennith Center, M.D.    Scheduled Meds: . famotidine (PEPCID) IV  20 mg Intravenous Q12H  . metoCLOPramide (REGLAN) injection  10 mg Intravenous Q8H  . pantoprazole (PROTONIX) IV  40 mg Intravenous Q12H   Continuous Infusions: . sodium chloride       Marinda Elk  [facility] Pager [phone]. If 8PM-8AM, please contact night-coverage at [email or web address], password [facility] 06/06/2013, 12:41 PM  LOS: 1 day
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
## 2013-06-07 DIAGNOSIS — R112 Nausea with vomiting, unspecified: Secondary | ICD-10-CM

## 2013-06-07 LAB — BASIC METABOLIC PANEL
BUN: 12 mg/dL (ref 6–23)
Chloride: 102 mEq/L (ref 96–112)
GFR calc Af Amer: >90 mL/min (ref >90)
Potassium: 3.8 mEq/L (ref 3.5–5.1)
Sodium: 136 mEq/L (ref 135–145)

## 2013-06-07 MED ORDER — PANTOPRAZOLE SODIUM 40 MG PO TBEC: 40.0000 mg | DELAYED_RELEASE_TABLET | Freq: Two times a day (BID) | ORAL | Status: DC

## 2013-06-07 NOTE — Care Management Note (Signed)
    Page 1 of 1   06/07/2013     12:38:04 PM   CARE MANAGEMENT NOTE 06/07/2013  Patient:  [name], [name]   Account Number:  [id number]  Date Initiated:  06/07/2013  Documentation initiated by:  Colleen Can  Subjective/Objective Assessment:   dx nausea and vomiting     Action/Plan:   Pt plans to go home upon diischarge. States he does not have primary care physician. Pt given information on [facility].   Anticipated DC Date:  06/07/2013   Anticipated DC Plan:        DC Planning Services  CM consult  PCP issues      Choice offered to / List presented to:             Status of service:   Medicare Important Message given?   (If response is "NO", the following Medicare IM given date fields will be blank) Date Medicare IM given:   Date Additional Medicare IM given:    Discharge Disposition:  HOME/SELF CARE  Per UR Regulation:  Reviewed for med. necessity/level of care/duration of stay  If discussed at Long Length of Stay Meetings, dates discussed:    Comments:

## 2013-06-07 NOTE — Progress Notes (Signed)
Patient discharged per MD order. Discharge papers reviewed with patient. IV removed and 1 prescription for protonix given to patient. Pt states he currently has no PCP. Case manager contact and number for the [facility] given to the patient for followup. Pt refused wheelchair (stating "I need to walk anyway") and walked downstairs for a ride home. Angelena Form, RN

## 2013-06-07 NOTE — ED Provider Notes (Signed)
Medical screening examination/treatment/procedure(s) were conducted as a shared visit with non-physician practitioner(s) and myself.  I personally evaluated the patient during the encounter  Patient with cyclic vomiting syndrome. Multiple episodes of emesis. Belly benign, unable to control N/V in ED. Will admit for intractable N/V.  Dagmar Hait, MD 06/07/13 [phone]

## 2013-06-07 NOTE — Discharge Summary (Signed)
Physician Discharge Summary  TOME WILSON WUJ:[id number] DOB: [birth year] DOA: 06/05/2013  PCP: Provider Not In System  Admit date: 06/05/2013 Discharge date: 06/07/2013  Time spent: 35 minutes  Recommendations for Outpatient Follow-up:  1. Follow up with PCP in 2 week  Discharge Diagnoses:  Active Problems:   Nausea and vomiting in adult   Dehydration   GERD (gastroesophageal reflux disease)   Discharge Condition: stable  Diet recommendation: regular  Filed Weights   06/05/13 1708  Weight: 88.2 kg (194 lb 7.1 oz)    History of present illness:  47 y.o. [name]  Past medical history of GERD and peptic ulcer disease for 5 years prior to admission that comes in for intractable nausea and vomiting this started the day prior to admission progressively getting worse. He relates no black stools or red stools. He denies any NSAIDs or steroids use. He denies any hematemesis. He denies any abdominal pain except when he is retching that he feels a strain in the epigastric area. He relates a rash, no new medications. Denies alcohol. Or sick contacts.    Hospital Course:  GERD (gastroesophageal reflux disease):  -  IV PPI, Clear liq diet. - Nausea improved with protonix. - He has no alarming symptoms. - Hbg remained stable. - follow up with PCP.  Nausea and vomiting in adult  - improved, due to #1.  - resolved.  Dehydration  - resolved with IV fluids.   Procedures:  none   Consultations:  none  Discharge Exam: Filed Vitals:   06/06/13 2133  BP: 152/91  Pulse: 61  Temp: 99.1 F (37.3 C)  Resp: 18    General: A&O x3 Cardiovascular: RRR Respiratory: good air movement CTA B/L  Discharge Instructions     Medication List    STOP taking these medications       ranitidine 75 MG tablet  Commonly known as:  ZANTAC      TAKE these medications       pantoprazole 40 MG tablet  Commonly known as:  PROTONIX  Take 1 tablet (40 mg total) by mouth 2 (two) times  daily.       Allergies  Allergen Reactions  . Ibuprofen Other (See Comments)    ulcers  . Tylenol [Acetaminophen] Other (See Comments)    Stomach upset       Follow-up Information   Follow up with Provider Not In System.   Contact information:   [address]ta K[address] [place] [address] [phone]        The results of significant diagnostics from this hospitalization (including imaging, microbiology, ancillary and laboratory) are listed below for reference.    Significant Diagnostic Studies: Dg Abd Acute W/chest  06/05/2013   *RADIOLOGY REPORT*  Clinical Data: Abdominal pain and vomiting.  ACUTE ABDOMEN SERIES (ABDOMEN 2 VIEW & CHEST 1 VIEW)  Comparison: 02/07/2012  Findings: The lungs are clear without focal consolidation, edema, effusion or pneumothorax.  Cardiopericardial silhouette is within normal limits for size.  Imaged bony structures of the thorax are intact.  Upright film shows no evidence for intraperitoneal free air. There is no evidence for gaseous bowel dilation to suggest obstruction. Visualized bony structures are unremarkable.  IMPRESSION: Stable.  No acute cardiopulmonary findings.  Normal bowel gas pattern.   Original Report Authenticated By: Kennith Center, M.D.    Microbiology: No results found for this or any previous visit (from the past 240 hour(s)).   Labs: Basic Metabolic Panel:  Recent Labs Lab 06/05/13 1150  06/06/13 0500 06/07/13 0456  NA 138 139 136  K 3.8 3.7 3.8  CL 104 102 102  CO2 22 24 25   GLUCOSE 142* 121* 115*  BUN Steve 10 12   CREATININE 0.73 0.63 0.75  CALCIUM 10.1 9.8 9.8   Liver Function Tests:  Recent Labs Lab 06/05/13 1150 06/06/13 0500  AST 21 15  ALT 19 16  ALKPHOS 76 68  BILITOT 0.3 0.4  PROT 8.2 7.8  ALBUMIN 4.0 3.7    Recent Labs Lab 06/05/13 1150  LIPASE 18   No results found for this basename: AMMONIA,  in the last 168 hours CBC:  Recent Labs Lab 06/05/13 1150 06/06/13 0500  WBC Steve.0* 10.3   NEUTROABS 10.4*  --   HGB Steve.9 Steve.3  HCT 42.0 40.2  MCV 72.5* 72.3*  PLT 361 302   Cardiac Enzymes: No results found for this basename: CKTOTAL, CKMB, CKMBINDEX, TROPONINI,  in the last 168 hours BNP: BNP (last 3 results) No results found for this basename: PROBNP,  in the last 8760 hours CBG: No results found for this basename: GLUCAP,  in the last 168 hours   Signed:  Marinda Elk  [facility] 06/07/2013, 10:34 AM

## 2013-06-27 ENCOUNTER — Ambulatory Visit: Payer: Medicaid Other

## 2014-06-03 ENCOUNTER — Encounter (HOSPITAL_COMMUNITY): Payer: Self-pay | Admitting: Emergency Medicine

## 2014-06-03 ENCOUNTER — Emergency Department (HOSPITAL_COMMUNITY)
Admission: EM | Admit: 2014-06-03 | Discharge: 2014-06-03 | Disposition: A | Discharge status: Home / Self Care | Payer: Self-pay | Attending: Emergency Medicine | Admitting: Emergency Medicine

## 2014-06-03 ENCOUNTER — Emergency Department (HOSPITAL_COMMUNITY): Payer: Self-pay

## 2014-06-03 ENCOUNTER — Emergency Department (HOSPITAL_COMMUNITY): Payer: Medicaid Other

## 2014-06-03 DIAGNOSIS — Z79899 Other long term (current) drug therapy: Secondary | ICD-10-CM | POA: Insufficient documentation

## 2014-06-03 DIAGNOSIS — R112 Nausea with vomiting, unspecified: Secondary | ICD-10-CM | POA: Insufficient documentation

## 2014-06-03 DIAGNOSIS — K219 Gastro-esophageal reflux disease without esophagitis: Secondary | ICD-10-CM | POA: Insufficient documentation

## 2014-06-03 DIAGNOSIS — F17200 Nicotine dependence, unspecified, uncomplicated: Secondary | ICD-10-CM | POA: Insufficient documentation

## 2014-06-03 DIAGNOSIS — R1013 Epigastric pain: Secondary | ICD-10-CM | POA: Insufficient documentation

## 2014-06-03 LAB — COMPREHENSIVE METABOLIC PANEL
ALBUMIN: 4.6 g/dL (ref 3.5–5.2)
ALK PHOS: 76 U/L (ref 39–117)
ALT: 20 U/L (ref 0–53)
ANION GAP: 16 — AB (ref 5–15)
AST: 28 U/L (ref 0–37)
BUN: 15 mg/dL (ref 6–23)
CHLORIDE: 105 meq/L (ref 96–112)
CO2: 22 mEq/L (ref 19–32)
Calcium: 10 mg/dL (ref 8.4–10.5)
Creatinine, Ser: 0.68 mg/dL (ref 0.50–1.35)
GFR calc Af Amer: >90 mL/min (ref >90)
GFR calc non Af Amer: >90 mL/min (ref >90)
Glucose, Bld: 130 mg/dL — ABNORMAL HIGH (ref 70–99)
POTASSIUM: 3.8 meq/L (ref 3.7–5.3)
SODIUM: 143 meq/L (ref 137–147)
TOTAL PROTEIN: 8.9 g/dL — AB (ref 6.0–8.3)
Total Bilirubin: 0.5 mg/dL (ref 0.3–1.2)

## 2014-06-03 LAB — CBC WITH DIFFERENTIAL/PLATELET
BASOS ABS: 0 10*3/uL (ref 0.0–0.1)
BASOS PCT: 0 % (ref 0–1)
EOS ABS: 0 10*3/uL (ref 0.0–0.7)
Eosinophils Relative: 0 % (ref 0–5)
HCT: 41.8 % (ref 39.0–52.0)
Hemoglobin: 14.2 g/dL (ref 13.0–17.0)
Lymphocytes Relative: 16 % (ref 12–46)
Lymphs Abs: 1.8 10*3/uL (ref 0.7–4.0)
MCH: 24.4 pg — AB (ref 26.0–34.0)
MCHC: 34 g/dL (ref 30.0–36.0)
MCV: 71.7 fL — ABNORMAL LOW (ref 78.0–100.0)
Monocytes Absolute: 0.5 10*3/uL (ref 0.1–1.0)
Monocytes Relative: 4 % (ref 3–12)
NEUTROS ABS: 9.4 10*3/uL — AB (ref 1.7–7.7)
NEUTROS PCT: 80 % — AB (ref 43–77)
PLATELETS: 357 10*3/uL (ref 150–400)
RBC: 5.83 MIL/uL — ABNORMAL HIGH (ref 4.22–5.81)
RDW: 15.7 % — AB (ref 11.5–15.5)
WBC: 11.7 10*3/uL — ABNORMAL HIGH (ref 4.0–10.5)

## 2014-06-03 LAB — URINALYSIS, ROUTINE W REFLEX MICROSCOPIC
BILIRUBIN URINE: NEGATIVE
GLUCOSE, UA: NEGATIVE mg/dL
Hgb urine dipstick: NEGATIVE
Ketones, ur: 40 mg/dL — AB
LEUKOCYTES UA: NEGATIVE
NITRITE: NEGATIVE
PH: 7 (ref 5.0–8.0)
Protein, ur: 30 mg/dL — AB
SPECIFIC GRAVITY, URINE: 1.024 (ref 1.005–1.030)
Urobilinogen, UA: 0.2 mg/dL (ref 0.0–1.0)

## 2014-06-03 LAB — URINE MICROSCOPIC-ADD ON

## 2014-06-03 LAB — LIPASE, BLOOD: Lipase: 12 U/L (ref 11–59)

## 2014-06-03 LAB — ETHANOL

## 2014-06-03 MED ORDER — TRAMADOL HCL 50 MG PO TABS: 50.0000 mg | ORAL_TABLET | Freq: Four times a day (QID) | ORAL | Status: DC | PRN

## 2014-06-03 MED ORDER — PANTOPRAZOLE SODIUM 40 MG IV SOLR
40.0000 mg | Freq: Once | INTRAVENOUS | Status: AC
  Administered 2014-06-03: 40 mg via INTRAVENOUS
  Filled 2014-06-03: qty 40

## 2014-06-03 MED ORDER — GI COCKTAIL ~~LOC~~
30.0000 mL | Freq: Once | ORAL | Status: AC
  Administered 2014-06-03: 30 mL via ORAL
  Filled 2014-06-03: qty 30

## 2014-06-03 MED ORDER — SODIUM CHLORIDE 0.9 % IV BOLUS (SEPSIS)
1000.0000 mL | Freq: Once | INTRAVENOUS | Status: AC
  Administered 2014-06-03: 1000 mL via INTRAVENOUS

## 2014-06-03 MED ORDER — MORPHINE SULFATE 4 MG/ML IJ SOLN
6.0000 mg | Freq: Once | INTRAMUSCULAR | Status: AC
Start: 2014-06-03 — End: 2014-06-03
  Administered 2014-06-03: 6 mg via INTRAVENOUS
  Filled 2014-06-03: qty 2

## 2014-06-03 MED ORDER — METOCLOPRAMIDE HCL 5 MG/ML IJ SOLN
10.0000 mg | Freq: Once | INTRAMUSCULAR | Status: AC
  Administered 2014-06-03: 10 mg via INTRAVENOUS
  Filled 2014-06-03: qty 2

## 2014-06-03 MED ORDER — PROMETHAZINE HCL 25 MG/ML IJ SOLN
25.0000 mg | Freq: Once | INTRAMUSCULAR | Status: AC
  Administered 2014-06-03: 25 mg via INTRAVENOUS
  Filled 2014-06-03: qty 1

## 2014-06-03 MED ORDER — PROMETHAZINE HCL 25 MG PO TABS: 25.0000 mg | ORAL_TABLET | Freq: Four times a day (QID) | ORAL | Status: DC | PRN

## 2014-06-03 MED ORDER — IOHEXOL 300 MG/ML  SOLN
100.0000 mL | Freq: Once | INTRAMUSCULAR | Status: AC | PRN
  Administered 2014-06-03: 100 mL via INTRAVENOUS

## 2014-06-03 MED ORDER — MORPHINE SULFATE 4 MG/ML IJ SOLN
4.0000 mg | Freq: Once | INTRAMUSCULAR | Status: AC
Start: 2014-06-03 — End: 2014-06-03
  Administered 2014-06-03: 4 mg via INTRAVENOUS
  Filled 2014-06-03: qty 1

## 2014-06-03 MED ORDER — IOHEXOL 300 MG/ML  SOLN
50.0000 mL | Freq: Once | INTRAMUSCULAR | Status: AC | PRN
  Administered 2014-06-03: 50 mL via ORAL

## 2014-06-03 MED ORDER — ONDANSETRON HCL 4 MG/2ML IJ SOLN
4.0000 mg | Freq: Once | INTRAMUSCULAR | Status: AC
  Administered 2014-06-03: 4 mg via INTRAVENOUS
  Filled 2014-06-03: qty 2

## 2014-06-03 NOTE — ED Provider Notes (Addendum)
TIME SEEN: 2:15 PM  CHIEF COMPLAINT: Abdominal pain, vomiting  HPI: Patient is a 48 y.o. M with history of GERD, peptic ulcer, alcohol abuse who presents to the emergency department with abdominal pain that started this morning and vomiting. He reports he's had multiple episodes of nonbloody, nonbilious vomiting. Denies fevers, chills, diarrhea, bloody stool or melena, dysuria or hematuria, testicular pain or swelling. He has had a history of appendectomy. No other abdominal surgery. He states his pain feels similar to when he has had gastric ulcers. Denies any recent NSAID use. States he did drink alcohol last night. No aggravating or relieving factors. No radiation of pain. Described as a sharp pain.  ROS: See HPI Constitutional: no fever  Eyes: no drainage  ENT: no runny nose   Cardiovascular:  no chest pain  Resp: no SOB  GI:  vomiting GU: no dysuria Integumentary: no rash  Allergy: no hives  Musculoskeletal: no leg swelling  Neurological: no slurred speech ROS otherwise negative  PAST MEDICAL HISTORY/PAST SURGICAL HISTORY:  Past Medical History  Diagnosis Date  . GERD (gastroesophageal reflux disease)   . Gastric ulcer   . Alcohol abuse     MEDICATIONS:  Prior to Admission medications   Medication Sig Start Date End Date Taking? Authorizing Provider  omeprazole (PRILOSEC) 20 MG capsule Take 20 mg by mouth daily.   Yes Historical Provider, MD  ranitidine (ZANTAC) 150 MG tablet Take 150 mg by mouth 2 (two) times daily.   Yes Historical Provider, MD    ALLERGIES:  Allergies  Allergen Reactions  . Ibuprofen Other (See Comments)    ulcers  . Tylenol [Acetaminophen] Other (See Comments)    Stomach upset    SOCIAL HISTORY:  History  Substance Use Topics  . Smoking status: Current Every Day Smoker -- 0.50 packs/day for 30 years    Types: Cigarettes  . Smokeless tobacco: Never Used  . Alcohol Use: No     Comment: no drinks x 2-3 wks    FAMILY HISTORY: Family History   Problem Relation Age of Onset  . Hypertension Mother   . Diabetes Mother     EXAM: BP 163/76  Pulse 53  Temp(Src) 98.2 F (36.8 C) (Oral)  Resp 18  SpO2 100% CONSTITUTIONAL: Alert and oriented and responds appropriately to questions. Well-appearing; well-nourished HEAD: Normocephalic EYES: Conjunctivae clear, PERRL ENT: normal nose; no rhinorrhea; moist mucous membranes; pharynx without lesions noted NECK: Supple, no meningismus, no LAD  CARD: RRR; S1 and S2 appreciated; no murmurs, no clicks, no rubs, no gallops RESP: Normal chest excursion without splinting or tachypnea; breath sounds clear and equal bilaterally; no wheezes, no rhonchi, no rales,  ABD/GI: Normal bowel sounds; non-distended; soft, diffusely tender to palpation with diffuse voluntary guarding, no rebound or peritoneal signs BACK:  The back appears normal and is non-tender to palpation, there is no CVA tenderness EXT: Normal ROM in all joints; non-tender to palpation; no edema; normal capillary refill; no cyanosis    SKIN: Normal color for age and race; warm NEURO: Moves all extremities equally PSYCH: The patient's mood and manner are appropriate. Grooming and personal hygiene are appropriate.  MEDICAL DECISION MAKING: Patient here with abdominal pain. Differential diagnosis includes gastritis, GERD, peptic ulcer disease, pancreatitis, colitis, UTI. Will obtain labs, urine, abdominal x-ray. We'll give Protonix, GI cocktail, morphine, Phenergan. He has received Zofran but is still actively vomiting. We'll give IV fluids.  ED PROGRESS: Patient is still actively vomiting after Phenergan. We'll give Reglan. He is  still diffusely tender to palpation. Labs are unremarkable other than a mild leukocytosis. His last CT of his abdomen and pelvis was in 2011. Given his leukocytosis, persistent pain and nausea and vomiting, will obtain a CT of his abdomen and pelvis. Abdominal x-ray shows no acute findings.    4:29 PM  Signed out  to Dr. Rosalia Hammers.  Pt has not had any further vomiting after compazine only nausea.  Requesting more narcotics which I do not feel are indicated.  Dr. Rosalia Hammers to follow up on CT.  If no further vomiting and CT unremarkable, will dc home.       Layla Maw Poppy Mcafee, DO 06/03/14 1608  Layla Maw Blayke Cordrey, DO 06/03/14 1630

## 2014-06-03 NOTE — ED Provider Notes (Signed)
48 y.o. Male signed out by DR. Ward with history of pancreatitis, nausea and vomiting pending ct.  CT results show no acute abnormality. Patient without vomiting now.  Patient discharged as per Dr. Jolyn NapWard's plan.   Hilario Quarryanielle S Maymuna Detzel, MD 06/03/14 [phone]

## 2014-06-03 NOTE — ED Notes (Addendum)
Pt offered something to drink but refuses saying he is still nauseous. Medicated with phenergan less than  30 minutes ago. Will cont to monitor pt

## 2014-06-03 NOTE — ED Notes (Signed)
Bed: WA06 Expected date: 06/03/14 Expected time: 1:30 PM Means of arrival: Ambulance Comments: N/V

## 2014-06-03 NOTE — ED Notes (Signed)
Pt aware of need for urine sample, pt unable to provide at this time. Intervention: fluid administered. Will re-evaluate.

## 2014-06-03 NOTE — ED Notes (Signed)
Pt discharged to home. DC instructions discussed with pt. Prescriptions x 2 given for pain and nausea meds. No concerns voiced.  Left unit in good condition ambulating to checkout. Requested a cup of ginger ale prior to leaving unit. Same given. Pt expressed thanks. Vw, rn

## 2014-06-03 NOTE — ED Notes (Addendum)
Per EMS pt with Hx of reflux and gastric ulcers c/o n/v and lower quadrant abdominal pain since 0430 today. No hematemesis, no diarrhea, no blood in stool. EMS administered 4 mg IV zofran.  Pt states he ate food at a BBQ last night, states it was "food he wasn't supposed to eat."

## 2014-06-03 NOTE — Discharge Instructions (Signed)
Gastritis, Adult °Gastritis is soreness and swelling (inflammation) of the lining of the stomach. Gastritis can develop as a sudden onset (acute) or long-term (chronic) condition. If gastritis is not treated, it can lead to stomach bleeding and ulcers. °CAUSES  °Gastritis occurs when the stomach lining is weak or damaged. Digestive juices from the stomach then inflame the weakened stomach lining. The stomach lining may be weak or damaged due to viral or bacterial infections. One common bacterial infection is the Helicobacter pylori infection. Gastritis can also result from excessive alcohol consumption, taking certain medicines, or having too much acid in the stomach.  °SYMPTOMS  °In some cases, there are no symptoms. When symptoms are present, they may include: °· Pain or a burning sensation in the upper abdomen. °· Nausea. °· Vomiting. °· An uncomfortable feeling of fullness after eating. °DIAGNOSIS  °Your caregiver may suspect you have gastritis based on your symptoms and a physical exam. To determine the cause of your gastritis, your caregiver may perform the following: °· Blood or stool tests to check for the H pylori bacterium. °· Gastroscopy. A thin, flexible tube (endoscope) is passed down the esophagus and into the stomach. The endoscope has a light and camera on the end. Your caregiver uses the endoscope to view the inside of the stomach. °· Taking a tissue sample (biopsy) from the stomach to examine under a microscope. °TREATMENT  °Depending on the cause of your gastritis, medicines may be prescribed. If you have a bacterial infection, such as an H pylori infection, antibiotics may be given. If your gastritis is caused by too much acid in the stomach, H2 blockers or antacids may be given. Your caregiver may recommend that you stop taking aspirin, ibuprofen, or other nonsteroidal anti-inflammatory drugs (NSAIDs). °HOME CARE INSTRUCTIONS °· Only take over-the-counter or prescription medicines as directed by  your caregiver. °· If you were given antibiotic medicines, take them as directed. Finish them even if you start to feel better. °· Drink enough fluids to keep your urine clear or pale yellow. °· Avoid foods and drinks that make your symptoms worse, such as: °¨ Caffeine or alcoholic drinks. °¨ Chocolate. °¨ Peppermint or mint flavorings. °¨ Garlic and onions. °¨ Spicy foods. °¨ Citrus fruits, such as oranges, lemons, or limes. °¨ Tomato-based foods such as sauce, chili, salsa, and pizza. °¨ Fried and fatty foods. °· Eat small, frequent meals instead of large meals. °SEEK IMMEDIATE MEDICAL CARE IF:  °· You have black or dark red stools. °· You vomit blood or material that looks like coffee grounds. °· You are unable to keep fluids down. °· Your abdominal pain gets worse. °· You have a fever. °· You do not feel better after 1 week. °· You have any other questions or concerns. °MAKE SURE YOU: °· Understand these instructions. °· Will watch your condition. °· Will get help right away if you are not doing well or get worse. °Document Released: 09/29/2001 Document Revised: 04/05/2012 Document Reviewed: 11/18/2011 °Exit[employer]® Patient Information ©2015 [employer]. This information is not intended to replace advice given to you by your health care provider. Make sure you discuss any questions you have with your health care provider. ° °Nausea and Vomiting °Nausea is a sick feeling that often comes before throwing up (vomiting). Vomiting is a reflex where stomach contents come out of your mouth. Vomiting can cause severe loss of body fluids (dehydration). Children and elderly adults can become dehydrated quickly, especially if they also have diarrhea. Nausea and vomiting are   symptoms of a condition or disease. It is important to find the cause of your symptoms. °CAUSES  °· Direct irritation of the stomach lining. This irritation can result from increased acid production (gastroesophageal reflux disease), infection, food  poisoning, taking certain medicines (such as nonsteroidal anti-inflammatory drugs), alcohol use, or tobacco use. °· Signals from the brain. These signals could be caused by a headache, heat exposure, an inner ear disturbance, increased pressure in the brain from injury, infection, a tumor, or a concussion, pain, emotional stimulus, or metabolic problems. °· An obstruction in the gastrointestinal tract (bowel obstruction). °· Illnesses such as diabetes, hepatitis, gallbladder problems, appendicitis, kidney problems, cancer, sepsis, atypical symptoms of a heart attack, or eating disorders. °· Medical treatments such as chemotherapy and radiation. °· Receiving medicine that makes you sleep (general anesthetic) during surgery. °DIAGNOSIS °Your caregiver may ask for tests to be done if the problems do not improve after a few days. Tests may also be done if symptoms are severe or if the reason for the nausea and vomiting is not clear. Tests may include: °· Urine tests. °· Blood tests. °· Stool tests. °· Cultures (to look for evidence of infection). °· X-rays or other imaging studies. °Test results can help your caregiver make decisions about treatment or the need for additional tests. °TREATMENT °You need to stay well hydrated. Drink frequently but in small amounts. You may wish to drink water, sports drinks, clear broth, or eat frozen ice pops or gelatin dessert to help stay hydrated. When you eat, eating slowly may help prevent nausea. There are also some antinausea medicines that may help prevent nausea. °HOME CARE INSTRUCTIONS  °· Take all medicine as directed by your caregiver. °· If you do not have an appetite, do not force yourself to eat. However, you must continue to drink fluids. °· If you have an appetite, eat a normal diet unless your caregiver tells you differently. °¨ Eat a variety of complex carbohydrates (rice, wheat, potatoes, bread), lean meats, yogurt, fruits, and vegetables. °¨ Avoid high-fat foods  because they are more difficult to digest. °· Drink enough water and fluids to keep your urine clear or pale yellow. °· If you are dehydrated, ask your caregiver for specific rehydration instructions. Signs of dehydration may include: °¨ Severe thirst. °¨ Dry lips and mouth. °¨ Dizziness. °¨ Dark urine. °¨ Decreasing urine frequency and amount. °¨ Confusion. °¨ Rapid breathing or pulse. °SEEK IMMEDIATE MEDICAL CARE IF:  °· You have blood or brown flecks (like coffee grounds) in your vomit. °· You have black or bloody stools. °· You have a severe headache or stiff neck. °· You are confused. °· You have severe abdominal pain. °· You have chest pain or trouble breathing. °· You do not urinate at least once every 8 hours. °· You develop cold or clammy skin. °· You continue to vomit for longer than 24 to 48 hours. °· You have a fever. °MAKE SURE YOU:  °· Understand these instructions. °· Will watch your condition. °· Will get help right away if you are not doing well or get worse. °Document Released: 10/05/2005 Document Revised: 12/28/2011 Document Reviewed: 03/04/2011 °ExitCare® Patient Information ©2015 [employer]. This information is not intended to replace advice given to you by your health care provider. Make sure you discuss any questions you have with your health care provider. ° °

## 2014-08-22 ENCOUNTER — Emergency Department (HOSPITAL_COMMUNITY)
Admission: EM | Admit: 2014-08-22 | Discharge: 2014-08-23 | Disposition: A | Discharge status: Home / Self Care | Payer: Medicaid Other | Attending: Emergency Medicine | Admitting: Emergency Medicine

## 2014-08-22 DIAGNOSIS — R112 Nausea with vomiting, unspecified: Secondary | ICD-10-CM | POA: Insufficient documentation

## 2014-08-22 DIAGNOSIS — Z720 Tobacco use: Secondary | ICD-10-CM | POA: Insufficient documentation

## 2014-08-22 DIAGNOSIS — Z9049 Acquired absence of other specified parts of digestive tract: Secondary | ICD-10-CM | POA: Insufficient documentation

## 2014-08-22 DIAGNOSIS — R1084 Generalized abdominal pain: Secondary | ICD-10-CM | POA: Insufficient documentation

## 2014-08-22 DIAGNOSIS — Z79899 Other long term (current) drug therapy: Secondary | ICD-10-CM | POA: Insufficient documentation

## 2014-08-22 DIAGNOSIS — G8929 Other chronic pain: Secondary | ICD-10-CM | POA: Insufficient documentation

## 2014-08-22 DIAGNOSIS — K219 Gastro-esophageal reflux disease without esophagitis: Secondary | ICD-10-CM | POA: Insufficient documentation

## 2014-08-22 DIAGNOSIS — R011 Cardiac murmur, unspecified: Secondary | ICD-10-CM | POA: Insufficient documentation

## 2014-08-22 LAB — COMPREHENSIVE METABOLIC PANEL
ALBUMIN: 4.8 g/dL (ref 3.5–5.2)
ALT: 24 U/L (ref 0–53)
ANION GAP: 14 (ref 5–15)
AST: 29 U/L (ref 0–37)
Alkaline Phosphatase: 70 U/L (ref 39–117)
BUN: 12 mg/dL (ref 6–23)
CALCIUM: 9.8 mg/dL (ref 8.4–10.5)
CO2: 27 mEq/L (ref 19–32)
CREATININE: 0.79 mg/dL (ref 0.50–1.35)
Chloride: 102 mEq/L (ref 96–112)
GFR calc Af Amer: >90 mL/min (ref >90)
GFR calc non Af Amer: >90 mL/min (ref >90)
Glucose, Bld: 141 mg/dL — ABNORMAL HIGH (ref 70–99)
Potassium: 3.7 mEq/L (ref 3.7–5.3)
Sodium: 143 mEq/L (ref 137–147)
TOTAL PROTEIN: 8.4 g/dL — AB (ref 6.0–8.3)
Total Bilirubin: 0.4 mg/dL (ref 0.3–1.2)

## 2014-08-22 LAB — URINALYSIS, ROUTINE W REFLEX MICROSCOPIC
BILIRUBIN URINE: NEGATIVE
Glucose, UA: NEGATIVE mg/dL
Hgb urine dipstick: NEGATIVE
Ketones, ur: >80 mg/dL — AB
Leukocytes, UA: NEGATIVE
NITRITE: NEGATIVE
Protein, ur: 30 mg/dL — AB
SPECIFIC GRAVITY, URINE: 1.027 (ref 1.005–1.030)
UROBILINOGEN UA: 0.2 mg/dL (ref 0.0–1.0)
pH: 7 (ref 5.0–8.0)

## 2014-08-22 LAB — CBC WITH DIFFERENTIAL/PLATELET
BASOS PCT: 0 % (ref 0–1)
Basophils Absolute: 0 10*3/uL (ref 0.0–0.1)
EOS ABS: 0 10*3/uL (ref 0.0–0.7)
Eosinophils Relative: 0 % (ref 0–5)
HEMATOCRIT: 42.2 % (ref 39.0–52.0)
HEMOGLOBIN: 14.2 g/dL (ref 13.0–17.0)
Lymphocytes Relative: 10 % — ABNORMAL LOW (ref 12–46)
Lymphs Abs: 1.2 10*3/uL (ref 0.7–4.0)
MCH: 24.7 pg — AB (ref 26.0–34.0)
MCHC: 33.6 g/dL (ref 30.0–36.0)
MCV: 73.3 fL — ABNORMAL LOW (ref 78.0–100.0)
MONO ABS: 0.3 10*3/uL (ref 0.1–1.0)
MONOS PCT: 3 % (ref 3–12)
Neutro Abs: 10.1 10*3/uL — ABNORMAL HIGH (ref 1.7–7.7)
Neutrophils Relative %: 87 % — ABNORMAL HIGH (ref 43–77)
Platelets: 384 10*3/uL (ref 150–400)
RBC: 5.76 MIL/uL (ref 4.22–5.81)
RDW: 15.2 % (ref 11.5–15.5)
WBC: 11.6 10*3/uL — ABNORMAL HIGH (ref 4.0–10.5)

## 2014-08-22 LAB — URINE MICROSCOPIC-ADD ON

## 2014-08-22 LAB — LIPASE, BLOOD: LIPASE: 10 U/L — AB (ref 11–59)

## 2014-08-22 MED ORDER — SODIUM CHLORIDE 0.9 % IV BOLUS (SEPSIS)
1000.0000 mL | Freq: Once | INTRAVENOUS | Status: AC
  Administered 2014-08-22: 1000 mL via INTRAVENOUS

## 2014-08-22 MED ORDER — METOCLOPRAMIDE HCL 5 MG/ML IJ SOLN
10.0000 mg | Freq: Once | INTRAMUSCULAR | Status: AC
  Administered 2014-08-22: 10 mg via INTRAVENOUS
  Filled 2014-08-22: qty 2

## 2014-08-22 MED ORDER — PANTOPRAZOLE SODIUM 40 MG IV SOLR
40.0000 mg | Freq: Once | INTRAVENOUS | Status: AC
  Administered 2014-08-22: 40 mg via INTRAVENOUS
  Filled 2014-08-22: qty 40

## 2014-08-22 MED ORDER — PROMETHAZINE HCL 25 MG/ML IJ SOLN
12.5000 mg | Freq: Once | INTRAMUSCULAR | Status: AC
  Administered 2014-08-22: 12.5 mg via INTRAVENOUS
  Filled 2014-08-22: qty 1

## 2014-08-22 MED ORDER — MORPHINE SULFATE 4 MG/ML IJ SOLN
4.0000 mg | Freq: Once | INTRAMUSCULAR | Status: AC
  Administered 2014-08-22: 4 mg via INTRAVENOUS
  Filled 2014-08-22: qty 1
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
## 2014-08-22 NOTE — ED Notes (Signed)
Pt states that he has had abdominal pain since 8:30pm this morning. N/V. Denies diarrhea. 20g LAC. 8mg  zofran IVP. Vomiting persists. Alert and oriented.

## 2014-08-23 MED ORDER — TRAMADOL HCL 50 MG PO TABS: 50.0000 mg | ORAL_TABLET | Freq: Four times a day (QID) | ORAL | Status: DC | PRN

## 2014-08-23 MED ORDER — PROMETHAZINE HCL 25 MG PO TABS: 25.0000 mg | ORAL_TABLET | Freq: Four times a day (QID) | ORAL | Status: DC | PRN

## 2014-08-23 NOTE — Discharge Instructions (Signed)

## 2015-01-13 ENCOUNTER — Emergency Department (HOSPITAL_COMMUNITY): Payer: Medicaid Other

## 2015-01-13 ENCOUNTER — Emergency Department (HOSPITAL_COMMUNITY)
Admission: EM | Admit: 2015-01-13 | Discharge: 2015-01-13 | Disposition: A | Discharge status: Home / Self Care | Payer: Medicaid Other | Attending: Emergency Medicine | Admitting: Emergency Medicine

## 2015-01-13 ENCOUNTER — Encounter (HOSPITAL_COMMUNITY): Payer: Self-pay | Admitting: Emergency Medicine

## 2015-01-13 DIAGNOSIS — K219 Gastro-esophageal reflux disease without esophagitis: Secondary | ICD-10-CM | POA: Insufficient documentation

## 2015-01-13 DIAGNOSIS — R609 Edema, unspecified: Secondary | ICD-10-CM

## 2015-01-13 DIAGNOSIS — Z720 Tobacco use: Secondary | ICD-10-CM | POA: Insufficient documentation

## 2015-01-13 DIAGNOSIS — M25462 Effusion, left knee: Secondary | ICD-10-CM | POA: Insufficient documentation

## 2015-01-13 DIAGNOSIS — G8929 Other chronic pain: Secondary | ICD-10-CM | POA: Insufficient documentation

## 2015-01-13 MED ORDER — TRAMADOL HCL 50 MG PO TABS: 50.0000 mg | ORAL_TABLET | Freq: Four times a day (QID) | ORAL | Status: DC | PRN

## 2015-01-13 MED ORDER — OXYCODONE-ACETAMINOPHEN 5-325 MG PO TABS
2.0000 | ORAL_TABLET | Freq: Once | ORAL | Status: AC
  Administered 2015-01-13: 2 via ORAL
  Filled 2015-01-13: qty 2

## 2015-01-13 NOTE — ED Notes (Signed)
Pt states that he has been having lt knee pain and swelling since Friday.  Denies injury.  "I've had problems with my knee for years, they say it's arthritis."
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
## 2015-01-13 NOTE — Discharge Instructions (Signed)
Take tramadol as directed for your pain. Rest, ice and elevate your knee. Follow-up with orthopedics.  Knee Effusion The medical term for having fluid in your knee is effusion. This is often due to an internal derangement of the knee. This means something is wrong inside the knee. Some of the causes of fluid in the knee may be torn cartilage, a torn ligament, or bleeding into the joint from an injury. Your knee is likely more difficult to bend and move. This is often because there is increased pain and pressure in the joint. The time it takes for recovery from a knee effusion depends on different factors, including:   Type of injury.  Your age.  Physical and medical conditions.  Rehabilitation Strategies. How long you will be away from your normal activities will depend on what kind of knee problem you have and how much damage is present. Your knee has two types of cartilage. Articular cartilage covers the bone ends and lets your knee bend and move smoothly. Two menisci, thick pads of cartilage that form a rim inside the joint, help absorb shock and stabilize your knee. Ligaments bind the bones together and support your knee joint. Muscles move the joint, help support your knee, and take stress off the joint itself. CAUSES  Often an effusion in the knee is caused by an injury to one of the menisci. This is often a tear in the cartilage. Recovery after a meniscus injury depends on how much meniscus is damaged and whether you have damaged other knee tissue. Small tears may heal on their own with conservative treatment. Conservative means rest, limited weight bearing activity and muscle strengthening exercises. Your recovery may take up to 6 weeks.  TREATMENT  Larger tears may require surgery. Meniscus injuries may be treated during arthroscopy. Arthroscopy is a procedure in which your surgeon uses a small telescope like instrument to look in your knee. Your caregiver can make a more accurate diagnosis  (learning what is wrong) by performing an arthroscopic procedure. If your injury is on the inner margin of the meniscus, your surgeon may trim the meniscus back to a smooth rim. In other cases your surgeon will try to repair a damaged meniscus with stitches (sutures). This may make rehabilitation take longer, but may provide better long term result by helping your knee keep its shock absorption capabilities. Ligaments which are completely torn usually require surgery for repair. HOME CARE INSTRUCTIONS  Use crutches as instructed.  If a brace is applied, use as directed.  Once you are home, an ice pack applied to your swollen knee may help with discomfort and help decrease swelling.  Keep your knee raised (elevated) when you are not up and around or on crutches.  Only take over-the-counter or prescription medicines for pain, discomfort, or fever as directed by your caregiver.  Your caregivers will help with instructions for rehabilitation of your knee. This often includes strengthening exercises.  You may resume a normal diet and activities as directed. SEEK MEDICAL CARE IF:   There is increased swelling in your knee.  You notice redness, swelling, or increasing pain in your knee.  An unexplained oral temperature above 102 F (38.9 C) develops. SEEK IMMEDIATE MEDICAL CARE IF:   You develop a rash.  You have difficulty breathing.  You have any allergic reactions from medications you may have been given.  There is severe pain with any motion of the knee. MAKE SURE YOU:   Understand these instructions.  Will watch  your condition.  Will get help right away if you are not doing well or get worse. Document Released: 12/26/2003 Document Revised: 12/28/2011 Document Reviewed: 02/29/2008 [facility] CenterExit[facility] Patient Information 2015 [place]ExitCare, [place]LL[place]. This information is not intended to replace advice given to you by your health care provider. Make sure you discuss any questions you have with your  health care provider.

## 2015-02-03 ENCOUNTER — Emergency Department (HOSPITAL_COMMUNITY): Payer: Medicaid Other

## 2015-02-03 ENCOUNTER — Emergency Department (HOSPITAL_COMMUNITY): Payer: Self-pay

## 2015-02-03 ENCOUNTER — Encounter (HOSPITAL_COMMUNITY): Payer: Self-pay | Admitting: *Deleted

## 2015-02-03 ENCOUNTER — Emergency Department (HOSPITAL_COMMUNITY)
Admission: EM | Admit: 2015-02-03 | Discharge: 2015-02-03 | Disposition: A | Discharge status: Home / Self Care | Payer: Self-pay | Attending: Emergency Medicine | Admitting: Emergency Medicine

## 2015-02-03 DIAGNOSIS — R1013 Epigastric pain: Secondary | ICD-10-CM

## 2015-02-03 DIAGNOSIS — Z720 Tobacco use: Secondary | ICD-10-CM | POA: Insufficient documentation

## 2015-02-03 DIAGNOSIS — K209 Esophagitis, unspecified without bleeding: Secondary | ICD-10-CM

## 2015-02-03 DIAGNOSIS — Z9049 Acquired absence of other specified parts of digestive tract: Secondary | ICD-10-CM | POA: Insufficient documentation

## 2015-02-03 DIAGNOSIS — K219 Gastro-esophageal reflux disease without esophagitis: Secondary | ICD-10-CM | POA: Insufficient documentation

## 2015-02-03 DIAGNOSIS — Z79899 Other long term (current) drug therapy: Secondary | ICD-10-CM | POA: Insufficient documentation

## 2015-02-03 LAB — COMPREHENSIVE METABOLIC PANEL
ALT: 33 U/L (ref 0–53)
AST: 28 U/L (ref 0–37)
Albumin: 4.7 g/dL (ref 3.5–5.2)
Alkaline Phosphatase: 80 U/L (ref 39–117)
Anion gap: 11 (ref 5–15)
BUN: 19 mg/dL (ref 6–23)
CHLORIDE: 102 mmol/L (ref 96–112)
CO2: 28 mmol/L (ref 19–32)
Calcium: 9.5 mg/dL (ref 8.4–10.5)
Creatinine, Ser: 0.84 mg/dL (ref 0.50–1.35)
GFR calc Af Amer: >90 mL/min (ref >90)
Glucose, Bld: 156 mg/dL — ABNORMAL HIGH (ref 70–99)
Potassium: 3.6 mmol/L (ref 3.5–5.1)
SODIUM: 141 mmol/L (ref 135–145)
Total Bilirubin: 0.7 mg/dL (ref 0.3–1.2)
Total Protein: 8.5 g/dL — ABNORMAL HIGH (ref 6.0–8.3)

## 2015-02-03 LAB — CBC WITH DIFFERENTIAL/PLATELET
BASOS PCT: 0 % (ref 0–1)
Basophils Absolute: 0 10*3/uL (ref 0.0–0.1)
Eosinophils Absolute: 0 10*3/uL (ref 0.0–0.7)
Eosinophils Relative: 0 % (ref 0–5)
HCT: 46.3 % (ref 39.0–52.0)
Hemoglobin: 15.5 g/dL (ref 13.0–17.0)
LYMPHS ABS: 1.2 10*3/uL (ref 0.7–4.0)
LYMPHS PCT: 9 % — AB (ref 12–46)
MCH: 25 pg — ABNORMAL LOW (ref 26.0–34.0)
MCHC: 33.5 g/dL (ref 30.0–36.0)
MCV: 74.6 fL — AB (ref 78.0–100.0)
MONO ABS: 0.3 10*3/uL (ref 0.1–1.0)
Monocytes Relative: 2 % — ABNORMAL LOW (ref 3–12)
NEUTROS ABS: 11.4 10*3/uL — AB (ref 1.7–7.7)
Neutrophils Relative %: 89 % — ABNORMAL HIGH (ref 43–77)
Platelets: 365 10*3/uL (ref 150–400)
RBC: 6.21 MIL/uL — ABNORMAL HIGH (ref 4.22–5.81)
RDW: 15.2 % (ref 11.5–15.5)
WBC: 12.9 10*3/uL — ABNORMAL HIGH (ref 4.0–10.5)

## 2015-02-03 LAB — LIPASE, BLOOD: LIPASE: 14 U/L (ref 11–59)

## 2015-02-03 LAB — TROPONIN I

## 2015-02-03 MED ORDER — GI COCKTAIL ~~LOC~~
30.0000 mL | Freq: Once | ORAL | Status: AC
  Administered 2015-02-03: 30 mL via ORAL
  Filled 2015-02-03: qty 30

## 2015-02-03 MED ORDER — SODIUM CHLORIDE 0.9 % IV BOLUS (SEPSIS)
1000.0000 mL | Freq: Once | INTRAVENOUS | Status: AC
  Administered 2015-02-03: 1000 mL via INTRAVENOUS

## 2015-02-03 MED ORDER — PANTOPRAZOLE SODIUM 40 MG IV SOLR
40.0000 mg | Freq: Once | INTRAVENOUS | Status: AC
  Administered 2015-02-03: 40 mg via INTRAVENOUS
  Filled 2015-02-03: qty 40

## 2015-02-03 MED ORDER — MORPHINE SULFATE 4 MG/ML IJ SOLN
4.0000 mg | Freq: Once | INTRAMUSCULAR | Status: AC
Start: 2015-02-03 — End: 2015-02-03
  Administered 2015-02-03: 4 mg via INTRAVENOUS
  Filled 2015-02-03: qty 1

## 2015-02-03 MED ORDER — ONDANSETRON HCL 4 MG/2ML IJ SOLN
4.0000 mg | Freq: Once | INTRAMUSCULAR | Status: AC
  Administered 2015-02-03: 4 mg via INTRAVENOUS
  Filled 2015-02-03: qty 2

## 2015-02-03 MED ORDER — HYDROCODONE-ACETAMINOPHEN 5-325 MG PO TABS: 1.0000 | ORAL_TABLET | Freq: Four times a day (QID) | ORAL | Status: DC | PRN

## 2015-02-03 MED ORDER — IOHEXOL 300 MG/ML  SOLN
100.0000 mL | Freq: Once | INTRAMUSCULAR | Status: AC | PRN
  Administered 2015-02-03: 100 mL via INTRAVENOUS

## 2015-02-03 MED ORDER — METOCLOPRAMIDE HCL 5 MG/ML IJ SOLN
10.0000 mg | Freq: Once | INTRAMUSCULAR | Status: AC
  Administered 2015-02-03: 10 mg via INTRAVENOUS
  Filled 2015-02-03: qty 2

## 2015-02-03 MED ORDER — IOHEXOL 300 MG/ML  SOLN
50.0000 mL | Freq: Once | INTRAMUSCULAR | Status: AC | PRN
  Administered 2015-02-03: 50 mL via ORAL

## 2015-02-03 MED ORDER — OMEPRAZOLE 20 MG PO CPDR
20.0000 mg | DELAYED_RELEASE_CAPSULE | Freq: Two times a day (BID) | ORAL | Status: DC
Start: 2015-02-03 — End: 2016-05-11

## 2015-02-03 MED ORDER — HYDROMORPHONE HCL 1 MG/ML IJ SOLN
1.0000 mg | Freq: Once | INTRAMUSCULAR | Status: AC
  Administered 2015-02-03: 1 mg via INTRAVENOUS
  Filled 2015-02-03: qty 1

## 2015-02-03 NOTE — ED Provider Notes (Signed)
Care assumed from Dr. Ethelda ChickJacubowitz at shift change awaiting results of a CT scan. Patient presents here with epigastric abdominal pain and a history of pancreatitis. His lipase is normal and CT reveals no evidence for pancreatitis. The only significant abnormality noted in the scan is thickening of the distal esophagus consistent with esophagitis. This will be treated with Prilosec twice daily and when necessary return. There is nothing acute that requires surgical consultation or intervention.  Steve Lyonsouglas Diondre Pulis, MD 02/03/15 [phone]

## 2015-02-03 NOTE — ED Notes (Signed)
Bed: [id number] Expected date: 02/03/15 Expected time:  Means of arrival:  Comments: Abdominal Pain

## 2015-02-03 NOTE — Discharge Instructions (Signed)
Prilosec 20 mg twice daily for the next 2 weeks, then once daily thereafter.  Hydrocodone as prescribed as needed for pain.  Follow up with your primary Dr. if not improving in the next week.   Esophagitis Esophagitis is inflammation of the esophagus. It can involve swelling, soreness, and pain in the esophagus. This condition can make it difficult and painful to swallow. CAUSES  Most causes of esophagitis are not serious. Many different factors can cause esophagitis, including:  Gastroesophageal reflux disease (GERD). This is when acid from your stomach flows up into the esophagus.  Recurrent vomiting.  An allergic-type reaction.  Certain medicines, especially those that come in large pills.  Ingestion of harmful chemicals, such as household cleaning products.  Heavy alcohol use.  An infection of the esophagus.  Radiation treatment for cancer.  Certain diseases such as sarcoidosis, Crohn's disease, and scleroderma. These diseases may cause recurrent esophagitis. SYMPTOMS   Trouble swallowing.  Painful swallowing.  Chest pain.  Difficulty breathing.  Nausea.  Vomiting.  Abdominal pain. DIAGNOSIS  Your caregiver will take your history and do a physical exam. Depending upon what your caregiver finds, certain tests may also be done, including:  Barium X-ray. You will drink a solution that coats the esophagus, and X-rays will be taken.  Endoscopy. A lighted tube is put down the esophagus so your caregiver can examine the area.  Allergy tests. These can sometimes be arranged through follow-up visits. TREATMENT  Treatment will depend on the cause of your esophagitis. In some cases, steroids or other medicines may be given to help relieve your symptoms or to treat the underlying cause of your condition. Medicines that may be recommended include:  Viscous lidocaine, to soothe the esophagus.  Antacids.  Acid reducers.  Proton pump inhibitors.  Antiviral medicines  for certain viral infections of the esophagus.  Antifungal medicines for certain fungal infections of the esophagus.  Antibiotic medicines, depending on the cause of the esophagitis. HOME CARE INSTRUCTIONS   Avoid foods and drinks that seem to make your symptoms worse.  Eat small, frequent meals instead of large meals.  Avoid eating for the 3 hours prior to your bedtime.  If you have trouble taking pills, use a pill splitter to decrease the size and likelihood of the pill getting stuck or injuring the esophagus on the way down. Drinking water after taking a pill also helps.  Stop smoking if you smoke.  Maintain a healthy weight.  Wear loose-fitting clothing. Do not wear anything tight around your waist that causes pressure on your stomach.  Raise the head of your bed 6 to 8 inches with wood blocks to help you sleep. Extra pillows will not help.  Only take over-the-counter or prescription medicines as directed by your caregiver. SEEK IMMEDIATE MEDICAL CARE IF:  You have severe chest pain that radiates into your arm, neck, or jaw.  You feel sweaty, dizzy, or lightheaded.  You have shortness of breath.  You vomit blood.  You have difficulty or pain with swallowing.  You have bloody or black, tarry stools.  You have a fever.  You have a burning sensation in the chest more than 3 times a week for more than 2 weeks.  You cannot swallow, drink, or eat.  You drool because you cannot swallow your saliva. MAKE SURE YOU:  Understand these instructions.  Will watch your condition.  Will get help right away if you are not doing well or get worse. Document Released: 11/12/2004 Document Revised: 12/28/2011  Document Reviewed: 06/05/2011 [facility] Patient Information 2015 [place]ExitCare, [place]LL[place]. This information is not intended to replace advice given to you by your health care provider. Make sure you discuss any questions you have with your health care provider.  Abdominal Pain Many  things can cause abdominal pain. Usually, abdominal pain is not caused by a disease and will improve without treatment. It can often be observed and treated at home. Your health care provider will do a physical exam and possibly order blood tests and X-rays to help determine the seriousness of your pain. However, in many cases, more time must pass before a clear cause of the pain can be found. Before that point, your health care provider may not know if you need more testing or further treatment. HOME CARE INSTRUCTIONS  Monitor your abdominal pain for any changes. The following actions may help to alleviate any discomfort you are experiencing:  Only take over-the-counter or prescription medicines as directed by your health care provider.  Do not take laxatives unless directed to do so by your health care provider.  Try a clear liquid diet (broth, tea, or water) as directed by your health care provider. Slowly move to a bland diet as tolerated. SEEK MEDICAL CARE IF:  You have unexplained abdominal pain.  You have abdominal pain associated with nausea or diarrhea.  You have pain when you urinate or have a bowel movement.  You experience abdominal pain that wakes you in the night.  You have abdominal pain that is worsened or improved by eating food.  You have abdominal pain that is worsened with eating fatty foods.  You have a fever. SEEK IMMEDIATE MEDICAL CARE IF:   Your pain does not go away within 2 hours.  You keep throwing up (vomiting).  Your pain is felt only in portions of the abdomen, such as the right side or the left lower portion of the abdomen.  You pass bloody or black tarry stools. MAKE SURE YOU:  Understand these instructions.   Will watch your condition.   Will get help right away if you are not doing well or get worse.  Document Released: 07/15/2005 Document Revised: 10/10/2013 Document Reviewed: 06/14/2013 [facility]ExitCare Patient Information 2015 [place]ExitCare, [place]LLC.  This information is not intended to replace advice given to you by your health care provider. Make sure you discuss any questions you have with your health care provider.
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
## 2015-02-03 NOTE — ED Notes (Signed)
Per EMS pt coming from home with c/o n/v and upper abdominal pain since yesterday. Pt reports hx of GERD and sts "this feels like GERD attack". Pt sts he is not taking medications for GERD as prescribed.

## 2015-02-11 ENCOUNTER — Ambulatory Visit: Payer: Medicaid Other | Admitting: Internal Medicine

## 2015-12-31 ENCOUNTER — Emergency Department (HOSPITAL_COMMUNITY)
Admission: EM | Admit: 2015-12-31 | Discharge: 2016-01-01 | Disposition: A | Discharge status: Home / Self Care | Payer: Medicaid Other | Attending: Emergency Medicine | Admitting: Emergency Medicine

## 2015-12-31 ENCOUNTER — Emergency Department (HOSPITAL_COMMUNITY): Payer: Medicaid Other

## 2015-12-31 ENCOUNTER — Encounter (HOSPITAL_COMMUNITY): Payer: Self-pay | Admitting: Emergency Medicine

## 2015-12-31 DIAGNOSIS — F10129 Alcohol abuse with intoxication, unspecified: Secondary | ICD-10-CM | POA: Insufficient documentation

## 2015-12-31 DIAGNOSIS — K219 Gastro-esophageal reflux disease without esophagitis: Secondary | ICD-10-CM | POA: Insufficient documentation

## 2015-12-31 DIAGNOSIS — Z79899 Other long term (current) drug therapy: Secondary | ICD-10-CM | POA: Diagnosis not present

## 2015-12-31 DIAGNOSIS — M79641 Pain in right hand: Secondary | ICD-10-CM | POA: Diagnosis present

## 2015-12-31 DIAGNOSIS — F17210 Nicotine dependence, cigarettes, uncomplicated: Secondary | ICD-10-CM | POA: Insufficient documentation

## 2015-12-31 DIAGNOSIS — M20001 Unspecified deformity of right finger(s): Secondary | ICD-10-CM | POA: Diagnosis not present

## 2015-12-31 NOTE — ED Notes (Signed)
Bed: [id number] Expected date:  Expected time:  Means of arrival:  Comments: Combative

## 2015-12-31 NOTE — ED Notes (Signed)
[facility] right 5th digit due to possible cyst and swelling. Pt reports to excessive drinking appx 3-4 12 packs of beer. PD requesting clearance of hand in order to return to jail.
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
## 2015-12-31 NOTE — ED Notes (Signed)
Pt to be evaluated for swelling to right 5th digit that was noted on intake from [employer]. Per PD pt was beating wall with right hand at time of arrest unsure if that may be cause of swelling.

## 2016-01-01 NOTE — ED Notes (Signed)
MD at bedside. 

## 2016-03-21 ENCOUNTER — Encounter (HOSPITAL_COMMUNITY): Payer: Self-pay | Admitting: Nurse Practitioner

## 2016-03-21 ENCOUNTER — Emergency Department (HOSPITAL_COMMUNITY)
Admission: EM | Admit: 2016-03-21 | Discharge: 2016-03-21 | Disposition: A | Discharge status: Home / Self Care | Payer: Medicaid Other | Attending: Emergency Medicine | Admitting: Emergency Medicine

## 2016-03-21 DIAGNOSIS — Z79899 Other long term (current) drug therapy: Secondary | ICD-10-CM | POA: Insufficient documentation

## 2016-03-21 DIAGNOSIS — E876 Hypokalemia: Secondary | ICD-10-CM | POA: Insufficient documentation

## 2016-03-21 DIAGNOSIS — N179 Acute kidney failure, unspecified: Secondary | ICD-10-CM | POA: Insufficient documentation

## 2016-03-21 DIAGNOSIS — Z9049 Acquired absence of other specified parts of digestive tract: Secondary | ICD-10-CM | POA: Insufficient documentation

## 2016-03-21 DIAGNOSIS — K219 Gastro-esophageal reflux disease without esophagitis: Secondary | ICD-10-CM | POA: Insufficient documentation

## 2016-03-21 DIAGNOSIS — R1084 Generalized abdominal pain: Secondary | ICD-10-CM | POA: Insufficient documentation

## 2016-03-21 DIAGNOSIS — F17210 Nicotine dependence, cigarettes, uncomplicated: Secondary | ICD-10-CM | POA: Insufficient documentation

## 2016-03-21 DIAGNOSIS — F1010 Alcohol abuse, uncomplicated: Secondary | ICD-10-CM | POA: Insufficient documentation

## 2016-03-21 LAB — COMPREHENSIVE METABOLIC PANEL
ALT: 19 U/L (ref 17–63)
ANION GAP: 16 — AB (ref 5–15)
AST: 27 U/L (ref 15–41)
Albumin: 4.9 g/dL (ref 3.5–5.0)
Alkaline Phosphatase: 80 U/L (ref 38–126)
BILIRUBIN TOTAL: 0.9 mg/dL (ref 0.3–1.2)
BUN: 27 mg/dL — ABNORMAL HIGH (ref 6–20)
CO2: 28 mmol/L (ref 22–32)
Calcium: 10.4 mg/dL — ABNORMAL HIGH (ref 8.9–10.3)
Chloride: 92 mmol/L — ABNORMAL LOW (ref 101–111)
Creatinine, Ser: 1.4 mg/dL — ABNORMAL HIGH (ref 0.61–1.24)
GFR calc Af Amer: >60 mL/min (ref >60)
GFR calc non Af Amer: 58 mL/min — ABNORMAL LOW (ref >60)
Glucose, Bld: 133 mg/dL — ABNORMAL HIGH (ref 65–99)
POTASSIUM: 3.3 mmol/L — AB (ref 3.5–5.1)
SODIUM: 136 mmol/L (ref 135–145)
TOTAL PROTEIN: 8.5 g/dL — AB (ref 6.5–8.1)

## 2016-03-21 LAB — CBC
HEMATOCRIT: 48.2 % (ref 39.0–52.0)
HEMOGLOBIN: 16.4 g/dL (ref 13.0–17.0)
MCH: 24.8 pg — ABNORMAL LOW (ref 26.0–34.0)
MCHC: 34 g/dL (ref 30.0–36.0)
MCV: 73 fL — ABNORMAL LOW (ref 78.0–100.0)
Platelets: 353 10*3/uL (ref 150–400)
RBC: 6.6 MIL/uL — AB (ref 4.22–5.81)
RDW: 14.9 % (ref 11.5–15.5)
WBC: 12.7 10*3/uL — ABNORMAL HIGH (ref 4.0–10.5)

## 2016-03-21 LAB — LIPASE, BLOOD: Lipase: 17 U/L (ref 11–51)

## 2016-03-21 MED ORDER — POTASSIUM CHLORIDE CRYS ER 20 MEQ PO TBCR
40.0000 meq | EXTENDED_RELEASE_TABLET | Freq: Once | ORAL | Status: AC
  Administered 2016-03-21: 40 meq via ORAL
  Filled 2016-03-21: qty 2

## 2016-03-21 MED ORDER — FENTANYL CITRATE (PF) 100 MCG/2ML IJ SOLN
50.0000 ug | Freq: Once | INTRAMUSCULAR | Status: AC
  Administered 2016-03-21: 50 ug via INTRAVENOUS
  Filled 2016-03-21: qty 2

## 2016-03-21 MED ORDER — ONDANSETRON 8 MG PO TBDP: 8.0000 mg | ORAL_TABLET | Freq: Three times a day (TID) | ORAL | Status: DC | PRN

## 2016-03-21 MED ORDER — SODIUM CHLORIDE 0.9 % IV BOLUS (SEPSIS)
1000.0000 mL | Freq: Once | INTRAVENOUS | Status: AC
  Administered 2016-03-21: 1000 mL via INTRAVENOUS

## 2016-03-21 MED ORDER — METOCLOPRAMIDE HCL 5 MG/ML IJ SOLN
10.0000 mg | Freq: Once | INTRAMUSCULAR | Status: AC
  Administered 2016-03-21: 10 mg via INTRAVENOUS
  Filled 2016-03-21: qty 2

## 2016-03-21 MED ORDER — SODIUM CHLORIDE 0.9 % IV SOLN
INTRAVENOUS | Status: DC
  Administered 2016-03-21: 16:00:00 via INTRAVENOUS

## 2016-03-21 MED ORDER — OXYCODONE HCL 5 MG PO TABA: 5.0000 mg | ORAL_TABLET | Freq: Two times a day (BID) | ORAL | Status: DC

## 2016-03-21 MED ORDER — FAMOTIDINE IN NACL 20-0.9 MG/50ML-% IV SOLN
20.0000 mg | Freq: Once | INTRAVENOUS | Status: AC
  Administered 2016-03-21: 20 mg via INTRAVENOUS
  Filled 2016-03-21: qty 50

## 2016-03-21 MED ORDER — SODIUM CHLORIDE 0.9 % IV BOLUS (SEPSIS): 1000.0000 mL | Freq: Once | INTRAVENOUS | Status: DC

## 2016-03-21 NOTE — ED Notes (Addendum)
He c/o 2 day history of abd pain, n/v, sweats. He denies any urinary changes, diarrhea. Last BM was Wednesday. He reports he did drink alcohol on Tuesday, before the symptoms started. He reports he can not tolerate any oral intake and he is concerned for dehydration.

## 2016-03-21 NOTE — ED Notes (Signed)
EDP at bedside  
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
## 2016-03-21 NOTE — Discharge Instructions (Signed)
Abdominal Pain, Adult Many things can cause belly (abdominal) pain. Most times, the belly pain is not dangerous. Many cases of belly pain can be watched and treated at home. HOME CARE   Do not take medicines that help you go poop (laxatives) unless told to by your doctor.  Only take medicine as told by your doctor.  Eat or drink as told by your doctor. Your doctor will tell you if you should be on a special diet. GET HELP IF:  You do not know what is causing your belly pain.  You have belly pain while you are sick to your stomach (nauseous) or have runny poop (diarrhea).  You have pain while you pee or poop.  Your belly pain wakes you up at night.  You have belly pain that gets worse or better when you eat.  You have belly pain that gets worse when you eat fatty foods.  You have a fever. GET HELP RIGHT AWAY IF:   The pain does not go away within 2 hours.  You keep throwing up (vomiting).  The pain changes and is only in the right or left part of the belly.  You have bloody or tarry looking poop. MAKE SURE YOU:   Understand these instructions.  Will watch your condition.  Will get help right away if you are not doing well or get worse.   This information is not intended to replace advice given to you by your health care provider. Make sure you discuss any questions you have with your health care provider.   Document Released: 03/23/2008 Document Revised: 10/26/2014 Document Reviewed: 06/14/2013 Elsevier Interactive Patient Education 2016 [employer] Hypokalemia Hypokalemia means that the amount of potassium in the blood is lower than normal.Potassium is a chemical, called an electrolyte, that helps regulate the amount of fluid in the body. It also stimulates muscle contraction and helps nerves function properly.Most of the body's potassium is inside of cells, and only a very small amount is in the blood. Because the amount in the blood is so small, minor changes can  be life-threatening. CAUSES  Antibiotics.  Diarrhea or vomiting.  Using laxatives too much, which can cause diarrhea.  Chronic kidney disease.  Water pills (diuretics).  Eating disorders (bulimia).  Low magnesium level.  Sweating a lot. SIGNS AND SYMPTOMS  Weakness.  Constipation.  Fatigue.  Muscle cramps.  Mental confusion.  Skipped heartbeats or irregular heartbeat (palpitations).  Tingling or numbness. DIAGNOSIS  Your health care provider can diagnose hypokalemia with blood tests. In addition to checking your potassium level, your health care provider may also check other lab tests. TREATMENT Hypokalemia can be treated with potassium supplements taken by mouth or adjustments in your current medicines. If your potassium level is very low, you may need to get potassium through a vein (IV) and be monitored in the hospital. A diet high in potassium is also helpful. Foods high in potassium are:  Nuts, such as peanuts and pistachios.  Seeds, such as sunflower seeds and pumpkin seeds.  Peas, lentils, and lima beans.  Whole grain and bran cereals and breads.  Fresh fruit and vegetables, such as apricots, avocado, bananas, cantaloupe, kiwi, oranges, tomatoes, asparagus, and potatoes.  Orange and tomato juices.  Red meats.  Fruit yogurt. HOME CARE INSTRUCTIONS  Take all medicines as prescribed by your health care provider.  Maintain a healthy diet by including nutritious food, such as fruits, vegetables, nuts, whole grains, and lean meats.  If you are taking a  laxative, be sure to follow the directions on the label. SEEK MEDICAL CARE IF:  Your weakness gets worse.  You feel your heart pounding or racing.  You are vomiting or having diarrhea.  You are diabetic and having trouble keeping your blood glucose in the normal range. SEEK IMMEDIATE MEDICAL CARE IF:  You have chest pain, shortness of breath, or dizziness.  You are vomiting or having diarrhea  for more than 2 days.  You faint. MAKE SURE YOU:   Understand these instructions.  Will watch your condition.  Will get help right away if you are not doing well or get worse.   This information is not intended to replace advice given to you by your health care provider. Make sure you discuss any questions you have with your health care provider.   Document Released: 10/05/2005 Document Revised: 10/26/2014 Document Reviewed: 04/07/2013 Elsevier Interactive Patient Education [employer]

## 2016-03-26 ENCOUNTER — Emergency Department (HOSPITAL_COMMUNITY)
Admission: EM | Admit: 2016-03-26 | Discharge: 2016-03-27 | Disposition: A | Discharge status: Home / Self Care | Payer: Medicaid Other | Attending: Emergency Medicine | Admitting: Emergency Medicine

## 2016-03-26 ENCOUNTER — Encounter (HOSPITAL_COMMUNITY): Payer: Self-pay

## 2016-03-26 DIAGNOSIS — Z760 Encounter for issue of repeat prescription: Secondary | ICD-10-CM | POA: Insufficient documentation

## 2016-03-26 DIAGNOSIS — Z79891 Long term (current) use of opiate analgesic: Secondary | ICD-10-CM | POA: Insufficient documentation

## 2016-03-26 DIAGNOSIS — F17210 Nicotine dependence, cigarettes, uncomplicated: Secondary | ICD-10-CM | POA: Insufficient documentation

## 2016-03-26 DIAGNOSIS — Z79899 Other long term (current) drug therapy: Secondary | ICD-10-CM | POA: Insufficient documentation

## 2016-03-26 DIAGNOSIS — K219 Gastro-esophageal reflux disease without esophagitis: Secondary | ICD-10-CM | POA: Insufficient documentation

## 2016-03-26 LAB — URINALYSIS, ROUTINE W REFLEX MICROSCOPIC
BILIRUBIN URINE: NEGATIVE
GLUCOSE, UA: NEGATIVE mg/dL
Hgb urine dipstick: NEGATIVE
Ketones, ur: NEGATIVE mg/dL
Leukocytes, UA: NEGATIVE
NITRITE: NEGATIVE
PH: 6.5 (ref 5.0–8.0)
Protein, ur: NEGATIVE mg/dL
SPECIFIC GRAVITY, URINE: 1.023 (ref 1.005–1.030)

## 2016-03-26 LAB — CBC
HEMATOCRIT: 40.3 % (ref 39.0–52.0)
HEMOGLOBIN: 13 g/dL (ref 13.0–17.0)
MCH: 24.3 pg — ABNORMAL LOW (ref 26.0–34.0)
MCHC: 32.3 g/dL (ref 30.0–36.0)
MCV: 75.3 fL — ABNORMAL LOW (ref 78.0–100.0)
Platelets: 293 10*3/uL (ref 150–400)
RBC: 5.35 MIL/uL (ref 4.22–5.81)
RDW: 15 % (ref 11.5–15.5)
WBC: 6.1 10*3/uL (ref 4.0–10.5)

## 2016-03-26 LAB — COMPREHENSIVE METABOLIC PANEL
ALBUMIN: 3.8 g/dL (ref 3.5–5.0)
ALT: 16 U/L — ABNORMAL LOW (ref 17–63)
ANION GAP: 6 (ref 5–15)
AST: 23 U/L (ref 15–41)
Alkaline Phosphatase: 65 U/L (ref 38–126)
BILIRUBIN TOTAL: 0.6 mg/dL (ref 0.3–1.2)
BUN: 12 mg/dL (ref 6–20)
CO2: 33 mmol/L — AB (ref 22–32)
Calcium: 9.6 mg/dL (ref 8.9–10.3)
Chloride: 98 mmol/L — ABNORMAL LOW (ref 101–111)
Creatinine, Ser: 1.03 mg/dL (ref 0.61–1.24)
GFR calc Af Amer: >60 mL/min (ref >60)
GFR calc non Af Amer: >60 mL/min (ref >60)
GLUCOSE: 102 mg/dL — AB (ref 65–99)
Potassium: 3.4 mmol/L — ABNORMAL LOW (ref 3.5–5.1)
Sodium: 137 mmol/L (ref 135–145)
TOTAL PROTEIN: 6.3 g/dL — AB (ref 6.5–8.1)

## 2016-03-26 LAB — LIPASE, BLOOD: Lipase: 21 U/L (ref 11–51)

## 2016-03-26 MED ORDER — ONDANSETRON HCL 4 MG/2ML IJ SOLN
4.0000 mg | Freq: Once | INTRAMUSCULAR | Status: AC
  Administered 2016-03-27: 4 mg via INTRAVENOUS
  Filled 2016-03-26: qty 2

## 2016-03-26 MED ORDER — ONDANSETRON 4 MG PO TBDP
ORAL_TABLET | ORAL | Status: AC
  Filled 2016-03-26: qty 1

## 2016-03-26 MED ORDER — FAMOTIDINE IN NACL 20-0.9 MG/50ML-% IV SOLN
20.0000 mg | Freq: Once | INTRAVENOUS | Status: AC
  Administered 2016-03-27: 20 mg via INTRAVENOUS
  Filled 2016-03-26: qty 50

## 2016-03-26 MED ORDER — ONDANSETRON 4 MG PO TBDP
4.0000 mg | ORAL_TABLET | Freq: Once | ORAL | Status: AC | PRN
  Administered 2016-03-26: 4 mg via ORAL

## 2016-03-26 MED ORDER — MORPHINE SULFATE (PF) 2 MG/ML IV SOLN
4.0000 mg | Freq: Once | INTRAVENOUS | Status: AC
Start: 2016-03-26 — End: 2016-03-27
  Administered 2016-03-27: 4 mg via INTRAVENOUS
  Filled 2016-03-26: qty 2

## 2016-03-26 MED ORDER — SODIUM CHLORIDE 0.9 % IV BOLUS (SEPSIS)
1000.0000 mL | Freq: Once | INTRAVENOUS | Status: AC
Start: 2016-03-26 — End: 2016-03-27
  Administered 2016-03-27: 1000 mL via INTRAVENOUS

## 2016-03-26 NOTE — ED Notes (Signed)
Pt notified nurse first he was stepping outside for a minute. Pt was advised he was being roomed soon and to stay in waiting room. Pt was seen walking out of ER. Pts name placed in room shortly after with no answer x2.
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
## 2016-03-26 NOTE — ED Notes (Signed)
Pt reports abdominal pain, nausea and vomiting since last Saturday. He states he was seen here on Saturday and sent home with two prescriptions but was unable to get them filled. He states the abd pain/n/v has not went away.

## 2016-03-26 NOTE — ED Notes (Signed)
Pt requesting nausea medication. Pt brought back to triage to be given zofran.

## 2016-03-26 NOTE — ED Notes (Signed)
The pt has been ill since staurday with abd pain nv and was seen here Saturday night.  He was unable to get his zofran filled the cost was 77.00 dollars  And the rx for the type percocet was not stocked at any of the drug stores hes was  in

## 2016-03-27 MED ORDER — GI COCKTAIL ~~LOC~~
30.0000 mL | Freq: Once | ORAL | Status: AC
  Administered 2016-03-27: 30 mL via ORAL
  Filled 2016-03-27: qty 30

## 2016-03-27 MED ORDER — HYDROCODONE-ACETAMINOPHEN 5-325 MG PO TABS: 1.0000 | ORAL_TABLET | Freq: Four times a day (QID) | ORAL | Status: AC | PRN

## 2016-03-27 MED ORDER — PROMETHAZINE HCL 25 MG PO TABS: 25.0000 mg | ORAL_TABLET | Freq: Four times a day (QID) | ORAL | Status: AC | PRN

## 2016-03-27 NOTE — ED Notes (Signed)
Pain "better"

## 2016-04-02 ENCOUNTER — Ambulatory Visit: Payer: Medicaid Other | Attending: Internal Medicine

## 2016-04-02 MED FILL — ONDANSETRON ODT 8 MG TABLET: 8 | 7 days supply | Qty: 20 | Fill #0

## 2016-04-07 ENCOUNTER — Encounter (HOSPITAL_COMMUNITY): Payer: Self-pay

## 2016-04-07 ENCOUNTER — Emergency Department (HOSPITAL_COMMUNITY)
Admission: EM | Admit: 2016-04-07 | Discharge: 2016-04-07 | Disposition: A | Discharge status: Home / Self Care | Payer: Self-pay | Attending: Emergency Medicine | Admitting: Emergency Medicine

## 2016-04-07 ENCOUNTER — Emergency Department (HOSPITAL_COMMUNITY): Payer: Self-pay

## 2016-04-07 DIAGNOSIS — F17210 Nicotine dependence, cigarettes, uncomplicated: Secondary | ICD-10-CM | POA: Insufficient documentation

## 2016-04-07 DIAGNOSIS — R1013 Epigastric pain: Secondary | ICD-10-CM | POA: Insufficient documentation

## 2016-04-07 DIAGNOSIS — Z79899 Other long term (current) drug therapy: Secondary | ICD-10-CM | POA: Insufficient documentation

## 2016-04-07 LAB — COMPREHENSIVE METABOLIC PANEL
ALT: 18 U/L (ref 17–63)
AST: 23 U/L (ref 15–41)
Albumin: 5 g/dL (ref 3.5–5.0)
Alkaline Phosphatase: 67 U/L (ref 38–126)
Anion gap: 10 (ref 5–15)
BILIRUBIN TOTAL: 1.1 mg/dL (ref 0.3–1.2)
BUN: 15 mg/dL (ref 6–20)
CHLORIDE: 95 mmol/L — AB (ref 101–111)
CO2: 31 mmol/L (ref 22–32)
CREATININE: 0.92 mg/dL (ref 0.61–1.24)
Calcium: 9.9 mg/dL (ref 8.9–10.3)
GFR calc Af Amer: >60 mL/min (ref >60)
Glucose, Bld: 134 mg/dL — ABNORMAL HIGH (ref 65–99)
Potassium: 3.6 mmol/L (ref 3.5–5.1)
Sodium: 136 mmol/L (ref 135–145)
TOTAL PROTEIN: 8.6 g/dL — AB (ref 6.5–8.1)

## 2016-04-07 LAB — CBC
HCT: 44.2 % (ref 39.0–52.0)
Hemoglobin: 15.3 g/dL (ref 13.0–17.0)
MCH: 25 pg — ABNORMAL LOW (ref 26.0–34.0)
MCHC: 34.6 g/dL (ref 30.0–36.0)
MCV: 72.3 fL — AB (ref 78.0–100.0)
PLATELETS: 425 10*3/uL — AB (ref 150–400)
RBC: 6.11 MIL/uL — ABNORMAL HIGH (ref 4.22–5.81)
RDW: 15.8 % — AB (ref 11.5–15.5)
WBC: 7.9 10*3/uL (ref 4.0–10.5)

## 2016-04-07 LAB — URINALYSIS, ROUTINE W REFLEX MICROSCOPIC
BILIRUBIN URINE: NEGATIVE
GLUCOSE, UA: NEGATIVE mg/dL
KETONES UR: NEGATIVE mg/dL
Nitrite: NEGATIVE
PROTEIN: 30 mg/dL — AB
Specific Gravity, Urine: 1.023 (ref 1.005–1.030)
pH: 7.5 (ref 5.0–8.0)

## 2016-04-07 LAB — URINE MICROSCOPIC-ADD ON

## 2016-04-07 LAB — LIPASE, BLOOD: Lipase: 13 U/L (ref 11–51)

## 2016-04-07 MED ORDER — GI COCKTAIL ~~LOC~~
30.0000 mL | Freq: Once | ORAL | Status: AC
  Administered 2016-04-07: 30 mL via ORAL
  Filled 2016-04-07: qty 30

## 2016-04-07 MED ORDER — ONDANSETRON 4 MG PO TBDP
4.0000 mg | ORAL_TABLET | Freq: Once | ORAL | Status: AC | PRN
  Administered 2016-04-07: 4 mg via ORAL
  Filled 2016-04-07: qty 1

## 2016-04-07 MED ORDER — MORPHINE SULFATE (PF) 4 MG/ML IV SOLN
4.0000 mg | Freq: Once | INTRAVENOUS | Status: AC
  Administered 2016-04-07: 4 mg via INTRAVENOUS
  Filled 2016-04-07: qty 1

## 2016-04-07 MED ORDER — METOCLOPRAMIDE HCL 5 MG/ML IJ SOLN
10.0000 mg | Freq: Once | INTRAMUSCULAR | Status: AC
  Administered 2016-04-07: 10 mg via INTRAVENOUS
  Filled 2016-04-07: qty 2

## 2016-04-07 MED ORDER — SODIUM CHLORIDE 0.9 % IV BOLUS (SEPSIS)
2000.0000 mL | Freq: Once | INTRAVENOUS | Status: AC
  Administered 2016-04-07: 2000 mL via INTRAVENOUS

## 2016-04-07 MED ORDER — FAMOTIDINE IN NACL 20-0.9 MG/50ML-% IV SOLN
20.0000 mg | Freq: Two times a day (BID) | INTRAVENOUS | Status: DC
  Administered 2016-04-07: 20 mg via INTRAVENOUS
  Filled 2016-04-07: qty 50

## 2016-04-07 MED ORDER — METOCLOPRAMIDE HCL 10 MG PO TABS: 10.0000 mg | ORAL_TABLET | Freq: Four times a day (QID) | ORAL | Status: AC | PRN

## 2016-04-07 NOTE — ED Notes (Signed)
Pt seen recently for same.  N/V and abdominal pain since Saturday.  Dx with reflux.  Pt denies fever

## 2016-04-07 NOTE — ED Notes (Signed)
MD at bedside. 

## 2016-04-07 NOTE — Discharge Instructions (Signed)
Take Maalox 2 tablespoons after meals and at bedtime as well as Prilosec OTC to help with stomach discomfort. Take the medication prescribed as needed for nausea. Avoid alcohol as alcohol contributes to ulcers, gastritis and esophageal reflux. Keep your scheduled appointment at the sickle cell clinic next month. [facility] to schedule next available appointment.
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
## 2016-04-07 NOTE — Progress Notes (Signed)
Patient has been seen in the ED 4 times within the last six months.  Patient presents with abdominal pain and nausea.  Patient has [facility] with [facility] @1400  to establish care.  [facility] if he needs assistance with his medications.  Patient without pcp or insurance.

## 2016-04-07 NOTE — ED Notes (Signed)
Patient transported to X-ray 

## 2016-05-11 ENCOUNTER — Encounter: Payer: Self-pay | Admitting: Family Medicine

## 2016-05-11 ENCOUNTER — Ambulatory Visit (INDEPENDENT_AMBULATORY_CARE_PROVIDER_SITE_OTHER): Payer: No Typology Code available for payment source | Admitting: Family Medicine

## 2016-05-11 VITALS — BP 134/81 | HR 64 | Temp 98.5°F | Ht 71.0 in | Wt 188.0 lb

## 2016-05-11 DIAGNOSIS — R739 Hyperglycemia, unspecified: Secondary | ICD-10-CM

## 2016-05-11 DIAGNOSIS — R7309 Other abnormal glucose: Secondary | ICD-10-CM

## 2016-05-11 DIAGNOSIS — Z114 Encounter for screening for human immunodeficiency virus [HIV]: Secondary | ICD-10-CM

## 2016-05-11 LAB — LIPID PANEL
CHOL/HDL RATIO: 2 ratio (ref <=5.0)
CHOLESTEROL: 156 mg/dL (ref 125–200)
HDL: 79 mg/dL (ref >=40)
LDL CALC: 66 mg/dL (ref <130)
Triglycerides: 57 mg/dL (ref <150)
VLDL: 11 mg/dL (ref <30)

## 2016-05-11 LAB — POCT GLYCOSYLATED HEMOGLOBIN (HGB A1C): Hemoglobin A1C: 5.2

## 2016-05-11 MED ORDER — ONDANSETRON 8 MG PO TBDP: 8.0000 mg | ORAL_TABLET | Freq: Three times a day (TID) | ORAL | 0 refills | Status: AC | PRN

## 2016-05-11 MED ORDER — OMEPRAZOLE 20 MG PO CPDR: 20.0000 mg | DELAYED_RELEASE_CAPSULE | Freq: Two times a day (BID) | ORAL | 0 refills | Status: AC

## 2016-05-12 LAB — HIV ANTIBODY (ROUTINE TESTING W REFLEX): HIV: NONREACTIVE

## 2016-05-12 NOTE — Progress Notes (Signed)
Diontay Rosencrans, is a 50 y.o. male  WGN:[id number]  HQI:[id number]  DOB - 04-Apr-1966  CC:  Chief Complaint  Patient presents with  . New Patient (Initial Visit)    gastric ulcer dx years ago, complaints of bad acid reflux, left knee pain and swelling, finger has large cyst that needs to be removed       HPI: Arles Rumbold is a 50 y.o. male here to establish care. Patient has a history of alcohol abuse, hypertension,  gastric ulcers, acid reflux,left knee pain and swelling and has a large cyst on his left little finger. His medications include Zantac and omeprazole, both OTC. He also takes Zofran occassional form N/V. He reports only drinking occasionally, uses marijuana occasionally and smokes 1/2 pk cigarettes daily.   Allergies  Allergen Reactions  . Ibuprofen Other (See Comments)    ulcers  . Tylenol [Acetaminophen] Other (See Comments)    Stomach upset   Past Medical History:  Diagnosis Date  . Alcohol abuse   . Gastric ulcer   . GERD (gastroesophageal reflux disease)    Current Outpatient Prescriptions on File Prior to Visit  Medication Sig Dispense Refill  . ranitidine (ZANTAC) 150 MG tablet Take 150 mg by mouth 2 (two) times daily as needed for heartburn.    Marland Kitchen HYDROcodone-acetaminophen (NORCO/VICODIN) 5-325 MG tablet Take 1-2 tablets by mouth every 6 (six) hours as needed for severe pain. (Patient not taking: Reported on 05/11/2016) 10 tablet 0  . metoCLOPramide (REGLAN) 10 MG tablet Take 1 tablet (10 mg total) by mouth every 6 (six) hours as needed for nausea or vomiting (nausea/headache). (Patient not taking: Reported on 05/11/2016) 20 tablet 0  . promethazine (PHENERGAN) 25 MG tablet Take 1 tablet (25 mg total) by mouth every 6 (six) hours as needed for nausea or vomiting. (Patient not taking: Reported on 05/11/2016) 30 tablet 0   No current facility-administered medications on file prior to visit.    Family History  Problem Relation Age of Onset  . Hypertension Mother    . Diabetes Mother    Social History   Social History  . Marital status: Married    Spouse name: N/A  . Number of children: N/A  . Years of education: N/A   Occupational History  . Not on file.   Social History Main Topics  . Smoking status: Current Every Day Smoker    Packs/day: 0.50    Years: 30.00    Types: Cigarettes  . Smokeless tobacco: Never Used  . Alcohol use Yes     Comment: 3-4 12packs today  . Drug use:     Frequency: 4.0 times per week    Types: Marijuana  . Sexual activity: No   Other Topics Concern  . Not on file   Social History Narrative  . No narrative on file    Review of Systems: Constitutional: Negative for fever, chills, appetite change, weight loss,  Fatigue. Skin: Negative for rashes or lesions of concern.Positive for large cyst on left little finger HENT: Negative for ear pain, ear discharge.nose bleeds Eyes: Negative for pain, discharge, redness, itching and visual disturbance. Neck: Negative for pain, stiffness Respiratory: Negative for cough, shortness of breath,   Cardiovascular: Negative for chest pain, palpitations and leg swelling. Gastrointestinal: Negative for abdominal pain, nausea, vomiting, diarrhea, constipations Genitourinary: Negative for dysuria, urgency, frequency, hematuria,  Musculoskeletal: Negative for back pain, joint  swelling, and gait problem.Negative for weakness.Positive for pain in his left knee Neurological: Negative for dizziness, tremors,  seizures, syncope,   light-headedness, numbness and headaches.  Hematological: Negative for easy bruising or bleeding Psychiatric/Behavioral: Negative for depression, anxiety, decreased concentration, confusion   Objective:   Vitals:   05/11/16 1423  BP: 134/81  Pulse: 64  Temp: 98.5 F (36.9 C)    Physical Exam: Constitutional: Patient appears well-developed and well-nourished. No distress. HENT: Normocephalic, atraumatic, External right and left ear normal.  Oropharynx is clear and moist.  Eyes: Conjunctivae and EOM are normal. PERRLA, no scleral icterus. Neck: Normal ROM. Neck supple. No lymphadenopathy, No thyromegaly. CVS: RRR, S1/S2 +, no murmurs, no gallops, no rubs Pulmonary: Effort and breath sounds normal, no stridor, rhonchi, wheezes, rales.  Abdominal: Soft. Normoactive BS,, no distension, tenderness, rebound or guarding.  Musculoskeletal: Normal range of motion. No edema and no tenderness.  Neuro: Alert.Normal muscle tone coordination. Non-focal Skin: Skin is warm and dry. No rash noted. Not diaphoretic. No erythema. No pallor. There is a large cyst present on left little fincer.  Psychiatric: Normal mood and affect. Behavior, judgment, thought content normal.  Lab Results  Component Value Date   WBC 7.9 04/07/2016   HGB 15.3 04/07/2016   HCT 44.2 04/07/2016   MCV 72.3 (L) 04/07/2016   PLT 425 (H) 04/07/2016   Lab Results  Component Value Date   CREATININE 0.92 04/07/2016   BUN 15 04/07/2016   NA 136 04/07/2016   K 3.6 04/07/2016   CL 95 (L) 04/07/2016   CO2 31 04/07/2016    Lab Results  Component Value Date   HGBA1C 5.2 05/11/2016   Lipid Panel     Component Value Date/Time   CHOL 156 05/11/2016 1501   TRIG 57 05/11/2016 1501   HDL 79 05/11/2016 1501   CHOLHDL 2.0 05/11/2016 1501   VLDL 11 05/11/2016 1501   LDLCALC 66 05/11/2016 1501       Assessment and plan:   1. Elevated blood sugar  - POCT HgB A1C - Lipid panel - HIV antibody (with reflex)  2. Screening for HIV (human immunodeficiency virus) -HIV  3. Cyst on little finger -referral to hand surgeon if possible.   Follow-up 6 months and prn.  The patient was given clear instructions to go to ER or return to medical center if symptoms don't improve, worsen or new problems develop. The patient verbalized understanding.    Henrietta Hoover FNP  05/12/2016, 7:48 AM

## 2016-07-19 DEATH — deceased

## 2016-08-17 ENCOUNTER — Ambulatory Visit: Payer: No Typology Code available for payment source | Admitting: Family Medicine

## 2016-09-07 ENCOUNTER — Ambulatory Visit: Payer: No Typology Code available for payment source | Admitting: Family Medicine

## 2018-05-15 IMAGING — CR DG ABDOMEN ACUTE W/ 1V CHEST
4 series · 4 of 4 positions shown · non-contrast
Comparison: CT of the abdomen and pelvis, and chest and abdominal
radiographs performed 02/03/2015

CLINICAL DATA: Acute onset of generalized abdominal pain, nausea
and vomiting. Initial encounter.

EXAM:
DG ABDOMEN ACUTE W/ 1V CHEST

[w chest pa]
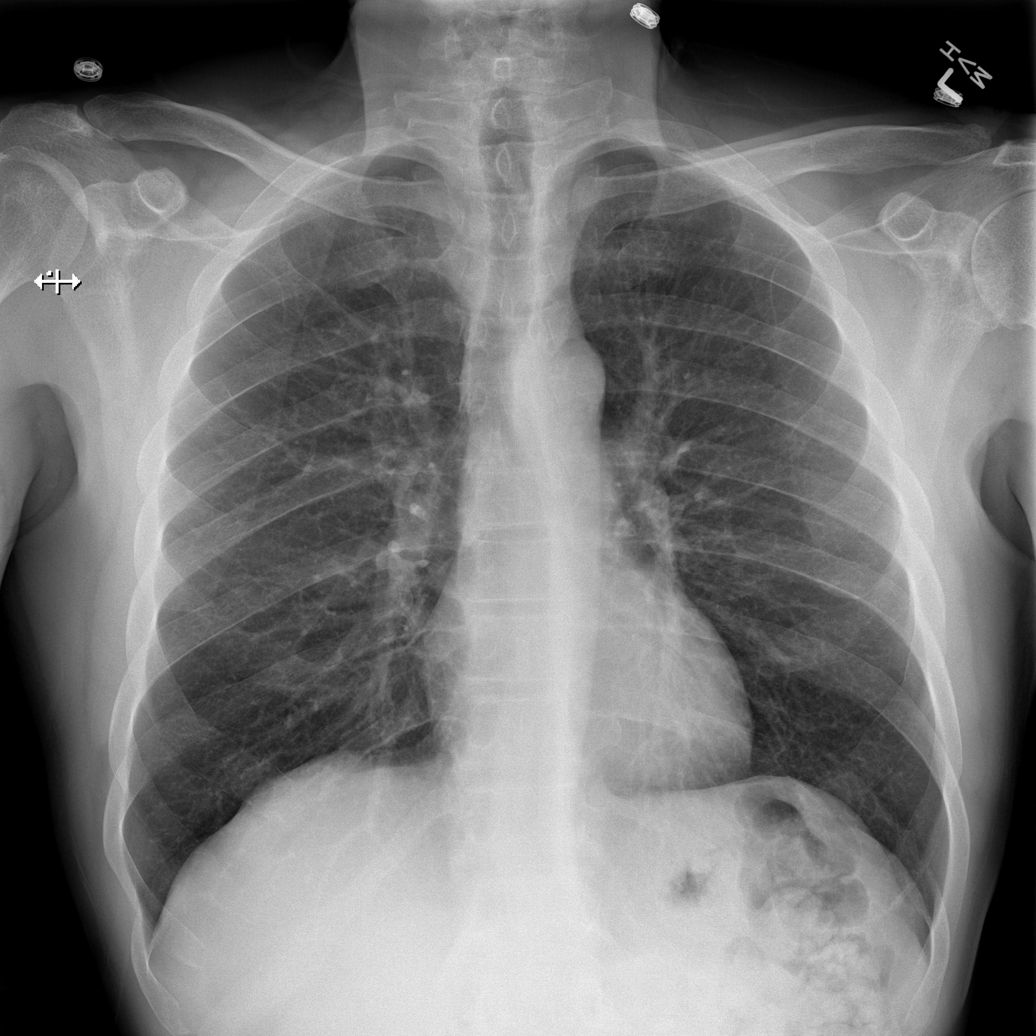

[w abdomen upright]
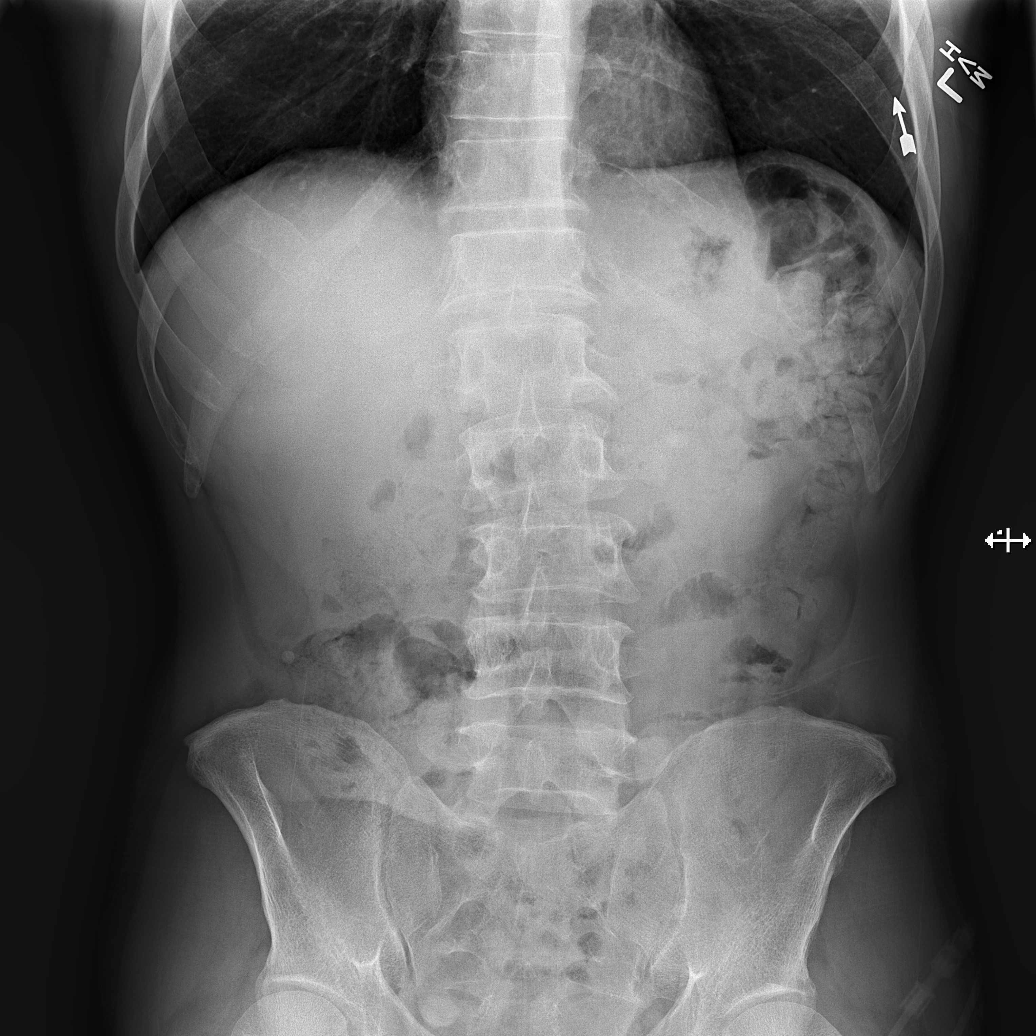

[t abdomen supine (1 of 2)]
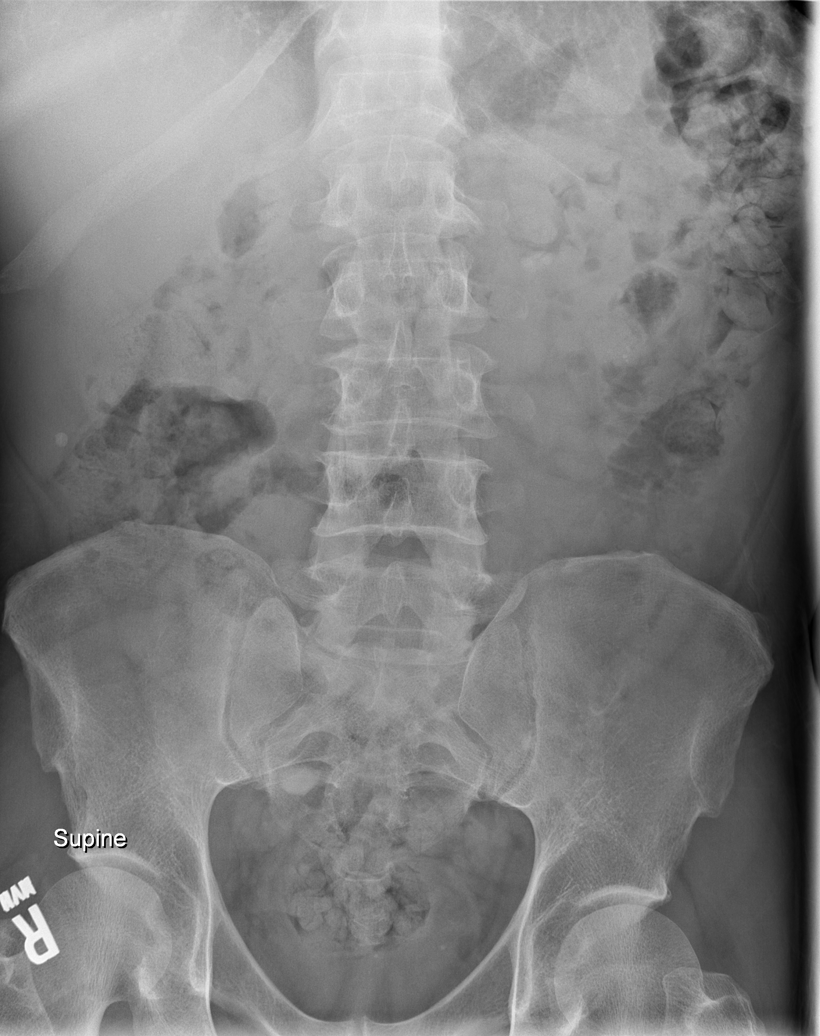

[t abdomen supine (2 of 2)]
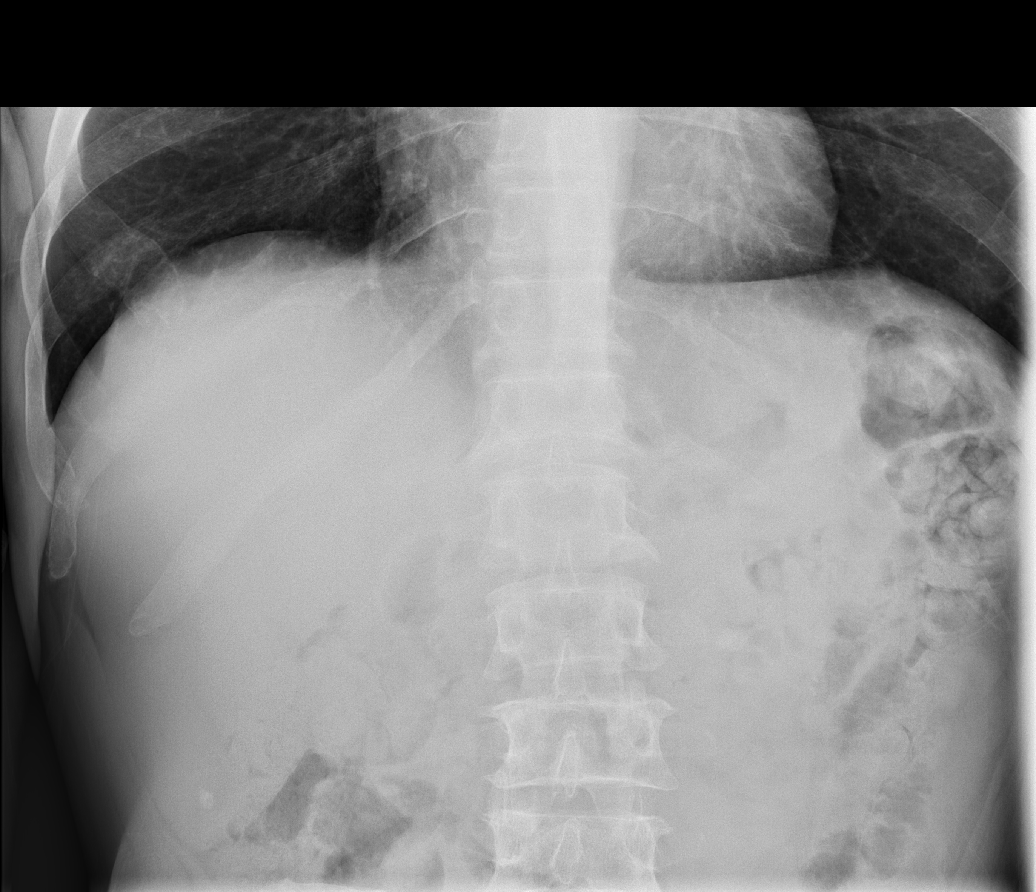

[4 of 4 positions shown; findings below may reference images not displayed]

FINDINGS: The lungs are well-aerated and clear. There is no evidence of focal
opacification, pleural effusion or pneumothorax. The
cardiomediastinal silhouette is within normal limits.

The visualized bowel gas pattern is unremarkable. Scattered stool
and air are seen within the colon; there is no evidence of small
bowel dilatation to suggest obstruction. No free intra-abdominal air
is identified on the provided upright view.

No acute osseous abnormalities are seen; the sacroiliac joints are
unremarkable in appearance.
IMPRESSION: 1. Unremarkable bowel gas pattern; no free intra-abdominal air seen.
Moderate amount of stool noted in the colon.
2. No acute cardiopulmonary process seen.
